# Patient Record
Sex: Male | Born: 1955 | Race: White | Hispanic: No | Smoking: Current every day smoker
Health system: Southern US, Community
[De-identification: ages and names within clinical notes are randomized; demographics above are authoritative.]

## PROBLEM LIST (undated history)

## (undated) DIAGNOSIS — I2510 Atherosclerotic heart disease of native coronary artery without angina pectoris: Secondary | ICD-10-CM

## (undated) DIAGNOSIS — I499 Cardiac arrhythmia, unspecified: Secondary | ICD-10-CM

## (undated) DIAGNOSIS — J449 Chronic obstructive pulmonary disease, unspecified: Secondary | ICD-10-CM

## (undated) DIAGNOSIS — R51 Headache: Secondary | ICD-10-CM

## (undated) DIAGNOSIS — F329 Major depressive disorder, single episode, unspecified: Secondary | ICD-10-CM

## (undated) DIAGNOSIS — D649 Anemia, unspecified: Secondary | ICD-10-CM

## (undated) DIAGNOSIS — F32A Depression, unspecified: Secondary | ICD-10-CM

## (undated) DIAGNOSIS — R519 Headache, unspecified: Secondary | ICD-10-CM

## (undated) DIAGNOSIS — G2581 Restless legs syndrome: Secondary | ICD-10-CM

## (undated) DIAGNOSIS — F419 Anxiety disorder, unspecified: Secondary | ICD-10-CM

## (undated) DIAGNOSIS — I10 Essential (primary) hypertension: Secondary | ICD-10-CM

## (undated) DIAGNOSIS — K219 Gastro-esophageal reflux disease without esophagitis: Secondary | ICD-10-CM

## (undated) DIAGNOSIS — R079 Chest pain, unspecified: Secondary | ICD-10-CM

## (undated) DIAGNOSIS — M1990 Unspecified osteoarthritis, unspecified site: Secondary | ICD-10-CM

## (undated) HISTORY — PX: TONSILLECTOMY: SUR1361

## (undated) HISTORY — PX: CARDIAC CATHETERIZATION: SHX172

## (undated) HISTORY — PX: COLONOSCOPY: SHX174

---

## 2016-07-14 ENCOUNTER — Other Ambulatory Visit: Payer: Self-pay | Admitting: Neurological Surgery

## 2016-07-19 ENCOUNTER — Encounter (HOSPITAL_COMMUNITY): Payer: Self-pay

## 2016-07-19 ENCOUNTER — Encounter (HOSPITAL_COMMUNITY)
Admission: RE | Admit: 2016-07-19 | Discharge: 2016-07-19 | Disposition: A | Discharge status: Home / Self Care | Payer: Medicare HMO | Source: Ambulatory Visit | Attending: Neurological Surgery | Admitting: Neurological Surgery

## 2016-07-19 ENCOUNTER — Encounter (HOSPITAL_COMMUNITY): Payer: Self-pay | Admitting: Vascular Surgery

## 2016-07-19 ENCOUNTER — Other Ambulatory Visit: Payer: Self-pay

## 2016-07-19 DIAGNOSIS — M4722 Other spondylosis with radiculopathy, cervical region: Secondary | ICD-10-CM | POA: Insufficient documentation

## 2016-07-19 DIAGNOSIS — Z01812 Encounter for preprocedural laboratory examination: Secondary | ICD-10-CM | POA: Diagnosis not present

## 2016-07-19 DIAGNOSIS — Z01818 Encounter for other preprocedural examination: Secondary | ICD-10-CM | POA: Diagnosis present

## 2016-07-19 HISTORY — DX: Chronic obstructive pulmonary disease, unspecified: J44.9

## 2016-07-19 HISTORY — DX: Headache, unspecified: R51.9

## 2016-07-19 HISTORY — DX: Unspecified osteoarthritis, unspecified site: M19.90

## 2016-07-19 HISTORY — DX: Headache: R51

## 2016-07-19 HISTORY — DX: Atherosclerotic heart disease of native coronary artery without angina pectoris: I25.10

## 2016-07-19 HISTORY — DX: Chest pain, unspecified: R07.9

## 2016-07-19 HISTORY — DX: Anemia, unspecified: D64.9

## 2016-07-19 HISTORY — DX: Gastro-esophageal reflux disease without esophagitis: K21.9

## 2016-07-19 HISTORY — DX: Depression, unspecified: F32.A

## 2016-07-19 HISTORY — DX: Major depressive disorder, single episode, unspecified: F32.9

## 2016-07-19 HISTORY — DX: Anxiety disorder, unspecified: F41.9

## 2016-07-19 LAB — CBC
HEMATOCRIT: 39.7 % (ref 39.0–52.0)
HEMOGLOBIN: 13.5 g/dL (ref 13.0–17.0)
MCH: 31.8 pg (ref 26.0–34.0)
MCHC: 34 g/dL (ref 30.0–36.0)
MCV: 93.6 fL (ref 78.0–100.0)
Platelets: 255 10*3/uL (ref 150–400)
RBC: 4.24 MIL/uL (ref 4.22–5.81)
RDW: 13.7 % (ref 11.5–15.5)
WBC: 11.4 10*3/uL — ABNORMAL HIGH (ref 4.0–10.5)

## 2016-07-19 LAB — TYPE AND SCREEN
ABO/RH(D): A POS
Antibody Screen: NEGATIVE

## 2016-07-19 LAB — BASIC METABOLIC PANEL
Anion gap: 10 (ref 5–15)
BUN: 6 mg/dL (ref 6–20)
CHLORIDE: 100 mmol/L — AB (ref 101–111)
CO2: 26 mmol/L (ref 22–32)
CREATININE: 0.85 mg/dL (ref 0.61–1.24)
Calcium: 9.2 mg/dL (ref 8.9–10.3)
GFR calc Af Amer: >60 mL/min (ref >60)
GFR calc non Af Amer: >60 mL/min (ref >60)
Glucose, Bld: 106 mg/dL — ABNORMAL HIGH (ref 65–99)
Potassium: 3.6 mmol/L (ref 3.5–5.1)
Sodium: 136 mmol/L (ref 135–145)

## 2016-07-19 LAB — SURGICAL PCR SCREEN
MRSA, PCR: NEGATIVE
Staphylococcus aureus: NEGATIVE

## 2016-07-19 LAB — ABO/RH: ABO/RH(D): A POS

## 2016-07-19 MED ORDER — CHLORHEXIDINE GLUCONATE CLOTH 2 % EX PADS: 6.0000 | MEDICATED_PAD | Freq: Once | CUTANEOUS | Status: DC

## 2016-07-19 NOTE — Pre-Procedure Instructions (Signed)
Todd Bowman  07/19/2016      [id number] - [place], [place] - [address] [place] [address] Phone: [phone] Fax: [phone]    Your procedure is scheduled on Thursday April 12.  Report to [facility] Admitting at 10:00 A.M.  Call this number if you have problems the morning of surgery:  [phone]   Remember:  Do not eat food or drink liquids after midnight.  Take these medicines the morning of surgery with A SIP OF WATER: pantoprazole (protonix), duloxetine (cymbalta), hydrocodone-acetaminophen (Norco) if needed, Symbicort, albuterol inhaler (please bring inhalers to hospital with you)  Take all other medications as prescribed except 7 days prior to surgery STOP taking any diclofenac (voltaren), Aspirin, Aleve, Naproxen, Ibuprofen, Motrin, Advil, Goody's, BC's, all herbal medications, fish oil, and all vitamins    Do not wear jewelry, make-up or nail polish.  Do not wear lotions, powders, or perfumes, or deoderant.  Do not shave 48 hours prior to surgery.  Men may shave face and neck.  Do not bring valuables to the hospital.  [facility] is not responsible for any belongings or valuables.  Contacts, dentures or bridgework may not be worn into surgery.  Leave your suitcase in the car.  After surgery it may be brought to your room.  For patients admitted to the hospital, discharge time will be determined by your treatment team.  Patients discharged the day of surgery will not be allowed to drive home.   Special instructions:    - Preparing For Surgery  Before surgery, you can play an important role. Because skin is not sterile, your skin needs to be as free of germs as possible. You can reduce the number of germs on your skin by washing with CHG (chlorahexidine gluconate) Soap before surgery.  CHG is an antiseptic cleaner which kills germs and bonds with the skin to continue killing germs even after  washing.  Please do not use if you have an allergy to CHG or antibacterial soaps. If your skin becomes reddened/irritated stop using the CHG.  Do not shave (including legs and underarms) for at least 48 hours prior to first CHG shower. It is OK to shave your face.  Please follow these instructions carefully.   1. Shower the NIGHT BEFORE SURGERY and the MORNING OF SURGERY with CHG.   2. If you chose to wash your hair, wash your hair first as usual with your normal shampoo.  3. After you shampoo, rinse your hair and body thoroughly to remove the shampoo.  4. Use CHG as you would any other liquid soap. You can apply CHG directly to the skin and wash gently with a scrungie or a clean washcloth.   5. Apply the CHG Soap to your body ONLY FROM THE NECK DOWN.  Do not use on open wounds or open sores. Avoid contact with your eyes, ears, mouth and genitals (private parts). Wash genitals (private parts) with your normal soap.  6. Wash thoroughly, paying special attention to the area where your surgery will be performed.  7. Thoroughly rinse your body with warm water from the neck down.  8. DO NOT shower/wash with your normal soap after using and rinsing off the CHG Soap.  9. Pat yourself dry with a CLEAN TOWEL.   10. Wear CLEAN PAJAMAS   11. Place CLEAN SHEETS on your bed the night of your first shower and DO NOT SLEEP WITH PETS.  Day of Surgery: Do not apply any deodorants/lotions. Please wear clean clothes to the hospital/surgery center.      Please read over the following fact sheets that you were given. Coughing and Deep Breathing and MRSA Information

## 2016-07-19 NOTE — Progress Notes (Signed)
PCP - Angela Cox Cardiologist - Pt saw Dr. Catha Gosselin in the past for chest pain in 2015, pt brought paper copy of EKG, echo, stress test, and cath from 2015. Reports placed in chart.   EKG - 07/19/16  Stress Test - 2015 ECHO - 2015 Cardiac Cath - 2015  Pt states he had chest tightness about a week ago which went away with Aspirin. Pt states he stopped taking aspirin already prior to surgery. Pt states he has not had any chest pain prior to that in a "long time". Revonda Standard to come speak with pt at PAT appointment. EKG performed.   Patient denies shortness of breath, fever, cough and chest pain at PAT appointment   Patient verbalized understanding of instructions that was given to them at the PAT appointment. Patient expressed that there were no further questions.  Patient was also instructed that they will need to review over the PAT instructions again at home before the surgery.

## 2016-07-19 NOTE — Progress Notes (Addendum)
Anesthesia PAT Evaluation: Patient is a 61 year old male scheduled for C5-6, C6-7 ACDF on 07/21/16 by Dr. Bevely Palmer.   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   Patient lives in [place], [place]. His cell number is [phone]. He also gave permission to call his long time friend Todd Bowman [phone]) if we needed to get a message to him to call us back.  PCP is Dr. Angela Cox [phone]) at [facility] [place] [facility] (previously of [facility] where he first started seeing her).   Cardiologist is Dr. Darlyn Chamber with [facility] [phone]), last visit 12/03/13. His friend goes to Dr. Danella Maiers, so if he needs cardiology re-evaluation he prefers to try him first. He is also willing to see a [facility] cardiologist in the [place] or [place] offices if needed.     Meds include albuterol, ASA 81 mg (on hold), baclofen, Symbicort, Cymbalta, Neurontin, Singulair, Protonix, prednisone X 5 days (last dose 06/18/16), Zantac, Zocor.   BP (!) 153/72   Pulse 62   Temp 36.8 C   Resp 20   Ht  (1.702 m)   Wt 172 lb 12.8 oz (78.4 kg)   SpO2 97%   BMI 27.06 kg/m  Patient is a pleasant Caucasian male in NAD. Heart regular rhythm, rate @ 52 bpm. No murmur noted. Lungs clear without wheezing. No carotid bruits noted. He denied BLE edema. Edentulous. Reports N/T in both hands for "a while" now. Denied grip weakness. He is on disability, but tries to stay active with yard work. Was able to pick up sticks in yard and cut a tree about a week ago, but had to rest multiple times due to SOB but no CP. However, he did report an episode of dull chest pain 2 months ago and again last week just before leaving his house to see Dr. Bevely Palmer. He took a baby ASA and said pain went away after ~ 5 minutes. He denied associated  N/V, diaphoresis, jaw or arm pain. He denied palpitations or syncope. He has not noticed any chest pain associated with activity. He did not tell Dr. Bevely Palmer about his heart history or chest pain symptoms.  He is a [name], but denied home O2 or hospitalization for COPD exacerbations. He has never seen a pulmonologist.   EKG 07/19/16: SB at 52 bpm.   Echo 10/30/13 [facility]): Summary: 1. Normal LV size with borderline normal function. Ejection fraction is 50-55%. Normal LV filling. There is moderate concentric hypertrophy. 2. Normal right ventricular size with normal function. 3. Mild tricuspid regurgitation with mildly redundant tricuspid valve chordae.  Carotid U/S 10/30/13 [facility]): Summary: 1. Right carotid: Normal right ICA. 2. Left carotid: Left ICA less than 50% stenosis. 3. Vertebrals: There is antegrade flow in both the right and left vertebral arteries.  Nuclear stress test 10/30/13 [facility]): Summary: 1. Low normal left ventricular systolic function with an ejection fraction 52%. 2. Moderate sized area of moderate intensity of reversible defect in the inferior wall suggestive of ischemia in the right coronary artery distribution. Cardiac cath recommended (see below).  Cardiac cath 11/06/13 [facility]): Findings: - Left main is a large-caliber vessel. It is moderate in length. It is bifurcated. It is free of disease.  - Left anterior descending is a moderate to large caliber, tortuous vessel which is long and wraps around the apex.  In its midportion of the second diagonal, it has an area of tubular 40-50% moderate stenosis with mid systolic compression. This area was evaluated in multiple views and was not angiographically significant. The LAD gives rise to 2 diagonals. The first is small to moderate and long and has a area of focal 50% late proximal early mid stenosis. The second diagonal is small and has a tubular 40-50% proximal  stenosis. - Left circumflex is a moderate-caliber, nondominant vessel. It gives rise to 2 obtuse marginals. The first is small to moderate and long and has a tubular proximal 50% stenosis. The mid circumflex is mildly irregular and terminates into very small OM 2. -Right coronary artery is a large-caliber, dominant vessel. It has mild 20-30% proximal and mid irregularities and focal 30% distal stenosis. It gives rise to a small posterior descending artery and then a large branching posterolateral artery. Both the branches of the posterolateral artery are moderate to large and unremarkable. - Left ventriculography: The left ventricle is small in size. Estimated ejection fraction 60%. There are no segmental wall motion abnormalities. Summary:  1. Mild to moderate nonobstructive coronary artery disease involving all vessels. 2. Normal left ventricular size and systolic function. Recommendations: The patient's symptoms are not due to unstable angina, and the stress test likely is an artifact. The patient may have vasospasm giving the moderate coronary artery disease and smoking and response to Imdur. The patient requested Imdur be discontinued as it interferes with using medication for erectile dysfunction. The patient's Imdur will be replaced with Norvasc 2.5 mg daily. He will be started on nicotine patch as well as Pravachol 20 mg at night. Smoking cessation was strongly emphasized.  Preoperative labs noted.   Patient with two episodes of non-exertional chest pain over the past two months. There were no associated symptoms, but with known mild-moderate CAD and on-going smoking without recent cardiology follow-up then he will need to see cardiology prior to surgery. I will notify staff at Dr. Mervyn Gay office. In the interim, I reviewed CV symptoms that should prompt EMS evaluation.    Velna Ochs [facility] [place] [facility] Stay [facility]/Anesthesiology Phone [phone] 07/19/2016 5:36 PM

## 2016-08-02 ENCOUNTER — Ambulatory Visit: Payer: Medicare HMO | Admitting: Cardiovascular Disease

## 2016-08-17 ENCOUNTER — Other Ambulatory Visit: Payer: Self-pay | Admitting: Neurological Surgery

## 2016-08-19 ENCOUNTER — Ambulatory Visit (HOSPITAL_COMMUNITY): Admission: RE | Admit: 2016-08-19 | Payer: Medicare HMO | Source: Ambulatory Visit | Admitting: Neurological Surgery

## 2016-08-19 SURGERY — ANTERIOR CERVICAL DECOMPRESSION/DISCECTOMY FUSION 2 LEVELS
Anesthesia: General

## 2016-09-06 ENCOUNTER — Other Ambulatory Visit: Payer: Self-pay | Admitting: Neurological Surgery

## 2016-09-21 ENCOUNTER — Encounter (HOSPITAL_COMMUNITY)
Admission: RE | Admit: 2016-09-21 | Discharge: 2016-09-21 | Disposition: A | Discharge status: Home / Self Care | Payer: Medicare HMO | Source: Ambulatory Visit | Attending: Neurological Surgery | Admitting: Neurological Surgery

## 2016-09-21 ENCOUNTER — Encounter (HOSPITAL_COMMUNITY): Payer: Self-pay

## 2016-09-21 DIAGNOSIS — M4722 Other spondylosis with radiculopathy, cervical region: Secondary | ICD-10-CM | POA: Insufficient documentation

## 2016-09-21 DIAGNOSIS — Z01818 Encounter for other preprocedural examination: Secondary | ICD-10-CM | POA: Insufficient documentation

## 2016-09-21 HISTORY — DX: Cardiac arrhythmia, unspecified: I49.9

## 2016-09-21 LAB — BASIC METABOLIC PANEL
Anion gap: 7 (ref 5–15)
BUN: 12 mg/dL (ref 6–20)
CALCIUM: 9.1 mg/dL (ref 8.9–10.3)
CO2: 24 mmol/L (ref 22–32)
CREATININE: 0.96 mg/dL (ref 0.61–1.24)
Chloride: 106 mmol/L (ref 101–111)
Glucose, Bld: 92 mg/dL (ref 65–99)
Potassium: 3.4 mmol/L — ABNORMAL LOW (ref 3.5–5.1)
SODIUM: 137 mmol/L (ref 135–145)

## 2016-09-21 LAB — CBC
HEMATOCRIT: 40.4 % (ref 39.0–52.0)
HEMOGLOBIN: 13.6 g/dL (ref 13.0–17.0)
MCH: 32.2 pg (ref 26.0–34.0)
MCHC: 33.7 g/dL (ref 30.0–36.0)
MCV: 95.5 fL (ref 78.0–100.0)
Platelets: 263 10*3/uL (ref 150–400)
RBC: 4.23 MIL/uL (ref 4.22–5.81)
RDW: 14.1 % (ref 11.5–15.5)
WBC: 9.6 10*3/uL (ref 4.0–10.5)

## 2016-09-21 LAB — TYPE AND SCREEN
ABO/RH(D): A POS
Antibody Screen: NEGATIVE

## 2016-09-21 LAB — SURGICAL PCR SCREEN
MRSA, PCR: NEGATIVE
STAPHYLOCOCCUS AUREUS: NEGATIVE

## 2016-09-21 NOTE — Progress Notes (Signed)
   09/21/16 1429  OBSTRUCTIVE SLEEP APNEA  Have you ever been diagnosed with sleep apnea through a sleep study? No  Do you snore loudly (loud enough to be heard through closed doors)?  1  Do you often feel tired, fatigued, or sleepy during the daytime (such as falling asleep during driving or talking to someone)? 0  Has anyone observed you stop breathing during your sleep? 1  Do you have, or are you being treated for high blood pressure? 1  BMI more than 35 kg/m2? 0  Age > 50 (1-yes) 1  Neck circumference greater than:Male 16 inches or larger, Male 17inches or larger? 0  Male Gender (Yes=1) 1  Obstructive Sleep Apnea Score 5  Score 5 or greater  Results sent to PCP

## 2016-09-21 NOTE — Pre-Procedure Instructions (Signed)
Todd ConteLarry R Bowman  09/21/2016      [id number] [place], [place] - [address] [place] [id number] Phone: [phone] Fax: [phone]  [facility] - [place], [place] - [address] [place] [id number] Phone: [phone] Fax: [phone]    Your procedure is scheduled on Monday June 18.  [facility] Admitting at 11:15 A.M.  Call this number if you have problems the morning of surgery:  [phone]   Remember:  Do not eat food or drink liquids after midnight.  Take these medicines the morning of surgery with A SIP OF WATER: amlodipine (norvasc), bupropion (wellbutrin), pantoprazole (protonix), hydrocodone (norco) if needed, symbicort, albuterol if needed (please bring inhaler to hospital with you)  7 days prior to surgery STOP taking any diclofenac (voltaren), Aspirin, Aleve, Naproxen, Ibuprofen, Motrin, Advil, Goody's, BC's, all herbal medications, fish oil, and all vitamins    Do not wear jewelry, make-up or nail polish.  Do not wear lotions, powders, or perfumes, or deoderant.  Do not shave 48 hours prior to surgery.  Men may shave face and neck.  Do not bring valuables to the hospital.  [facility] is not responsible for any belongings or valuables.  Contacts, dentures or bridgework may not be worn into surgery.  Leave your suitcase in the car.  After surgery it may be brought to your room.  For patients admitted to the hospital, discharge time will be determined by your treatment team.  Patients discharged the day of surgery will not be allowed to drive home.    Special instructions:    Todd Bowman- Preparing For Surgery  Before surgery, you can play an important role. Because skin is not sterile, your skin needs to be as free of germs as possible. You can reduce the number of germs on your skin by washing with CHG (chlorahexidine gluconate) Soap before surgery.  CHG  is an antiseptic cleaner which kills germs and bonds with the skin to continue killing germs even after washing.  Please do not use if you have an allergy to CHG or antibacterial soaps. If your skin becomes reddened/irritated stop using the CHG.  Do not shave (including legs and underarms) for at least 48 hours prior to first CHG shower. It is OK to shave your face.  Please follow these instructions carefully.   1. Shower the NIGHT BEFORE SURGERY and the MORNING OF SURGERY with CHG.   2. If you chose to wash your hair, wash your hair first as usual with your normal shampoo.  3. After you shampoo, rinse your hair and body thoroughly to remove the shampoo.  4. Use CHG as you would any other liquid soap. You can apply CHG directly to the skin and wash gently with a scrungie or a clean washcloth.   5. Apply the CHG Soap to your body ONLY FROM THE NECK DOWN.  Do not use on open wounds or open sores. Avoid contact with your eyes, ears, mouth and genitals (private parts). Wash genitals (private parts) with your normal soap.  6. Wash thoroughly, paying special attention to the area where your surgery will be performed.  7. Thoroughly rinse your body with warm water from the neck down.  8. DO NOT shower/wash with your normal soap after using and rinsing off the CHG Soap.  9. Pat yourself dry with a CLEAN TOWEL.   10. Wear CLEAN PAJAMAS   11. Place CLEAN SHEETS  on your bed the night of your first shower and DO NOT SLEEP WITH PETS.    Day of Surgery: Do not apply any deodorants/lotions. Please wear clean clothes to the hospital/surgery center.      Please read over the following fact sheets that you were given. MRSA Information

## 2016-09-21 NOTE — Progress Notes (Signed)
PCP - Dr. Carmon SailsBorino Cardiologist - Dr. Catha GosselinBanerjee   EKG - 07/19/16 Stress Test - pt had recent stress test- requested from Dr. Catha GosselinBanerjee ECHO - 2015 Cardiac Cath - 09/01/16, paper copy in chart  Pt previously cancelled d/t chest pain and need for cardiac evaluation. Pt states he had heart cath and has not had any more issues with chest pain. Chart forwarded to anesthesia for review Patient denies shortness of breath, fever, cough and chest pain at PAT appointment   Patient verbalized understanding of instructions that were given to them at the PAT appointment. Patient was also instructed that they will need to review over the PAT instructions again at home before surgery.

## 2016-09-22 ENCOUNTER — Encounter (HOSPITAL_COMMUNITY): Payer: Self-pay

## 2016-09-22 NOTE — Progress Notes (Signed)
Anesthesia Chart Review: Patient is a 61 year old male scheduled for C5-6, C6-7 ACDF on 09/26/16 by Dr. Bevely Palmeritty. Case was initially scheduled for 07/21/16, but was postponed to allow for cardiology evaluation due to episodes of non-exertional chest pain with known mild-moderate CAD from 2015 cath. Since then, he had an abnormal stress test followed by cardiac cath showing non-obstructive CAD. Cardiologist Dr. Darlyn Chambereepak Banerjee subsequently cleared patient at "low cardiovascular risk for cervical spine surgery under general anesthesia." .   History includes COPD, smoking (1 1/2 PPD X 50 years), anxiety, depression, GERD, headaches, arthritis, CAD (mild to moderate non-obstructive CAD 10/2013, 08/2016), anemia, tonsillectomy.   [place] in [place], [place]. His cell number is [phone]. In April 2018, he also gave permission to call his long time friend Blanchie DessertLinda Blankenship [phone]) if we needed to get a message to him to call us back.  PCP is Dr. Angela CoxAlethea Barrino [phone]) at [facility] ([facility] where he [facility] started seeing her).   Cardiologist is Dr. Darlyn Chambereepak Banerjee at [facility] [phone]).  Meds include albuterol, amlodipine, ASA 81 mg (on hold), baclofen, Symbicort, Wellbutrin SR, Neurontin, Norco, Singulair, Protonix, Zantac, Zocor.   BP 135/69   Pulse 65   Temp 36.7 C   Resp 20   Ht 5\' 7"  (1.702 m)   Wt 177 lb (80.3 kg)   SpO2 99%   BMI 27.72 kg/m   EKG 07/19/16: SB at 52 bpm.   LHC 09/01/16 ([place]; Dr. Alvino Bloodeepak Benerjee; done due to abnormal stress test showing "inferior ischemia"): Conclusion: 1. No significant obstructive coronary artery disease, mild coronary artery disease of the vessels as noted (40% mid LAD, 40% proximal D1 [small vessel], 20% ostial CX [small vessel], 20-30% proximal and mid RCA, 20% distal RCA). 2. Normal left ventricular size, systolic function. EF greater than  60%. Recommendations: 1. Continue aggressive risk factor modification including smoking cessation. 2. The patient has low cardiovascular risk for cervical spine surgery under general anesthesia.  Echo 10/30/13 [facility] Summary: 1. Normal LV size with borderline normal function. Ejection fraction is 50-55%. Normal LV filling. There is moderate concentric hypertrophy. 2. Normal right ventricular size with normal function. 3. Mild tricuspid regurgitation with mildly redundant tricuspid valve chordae.  Carotid U/S 10/30/13 [facility]): Summary: 1. Right carotid: Normal right ICA. 2. Left carotid: Left ICA less than 50% stenosis. 3. Vertebrals: There is antegrade flow in both the right and left vertebral arteries.  Preoperative labs noted.   He had a cardiac work-up for his chest pain that occurred several months ago. He has been cleared by cardiology. If no acute changes then I anticipate that he can proceed as planned.  Velna Ochsllison Hillari Zumwalt, PA-C [facility] Short Stay Center/Anesthesiology Phone [phone] 09/22/2016 11:47 AM

## 2016-09-25 MED ORDER — CEFAZOLIN SODIUM-DEXTROSE 2-4 GM/100ML-% IV SOLN
2.0000 g | INTRAVENOUS | Status: AC
  Administered 2016-09-26: 2 g via INTRAVENOUS
  Filled 2016-09-25: qty 100

## 2016-09-26 ENCOUNTER — Encounter (HOSPITAL_COMMUNITY)
Admission: RE | Disposition: A | Discharge status: Home / Self Care | Payer: Self-pay | Source: Ambulatory Visit | Attending: Neurological Surgery

## 2016-09-26 ENCOUNTER — Encounter (HOSPITAL_COMMUNITY): Payer: Self-pay | Admitting: General Practice

## 2016-09-26 ENCOUNTER — Ambulatory Visit (HOSPITAL_COMMUNITY): Payer: Medicare HMO

## 2016-09-26 ENCOUNTER — Ambulatory Visit (HOSPITAL_COMMUNITY)
Admission: RE | Admit: 2016-09-26 | Discharge: 2016-09-27 | Disposition: A | Discharge status: Home / Self Care | Payer: Medicare HMO | Source: Ambulatory Visit | Attending: Neurological Surgery | Admitting: Neurological Surgery

## 2016-09-26 ENCOUNTER — Ambulatory Visit (HOSPITAL_COMMUNITY): Payer: Medicare HMO | Admitting: Certified Registered Nurse Anesthetist

## 2016-09-26 ENCOUNTER — Ambulatory Visit (HOSPITAL_COMMUNITY): Payer: Medicare HMO | Admitting: Emergency Medicine

## 2016-09-26 DIAGNOSIS — F17200 Nicotine dependence, unspecified, uncomplicated: Secondary | ICD-10-CM | POA: Diagnosis not present

## 2016-09-26 DIAGNOSIS — Z79899 Other long term (current) drug therapy: Secondary | ICD-10-CM | POA: Diagnosis not present

## 2016-09-26 DIAGNOSIS — M4722 Other spondylosis with radiculopathy, cervical region: Secondary | ICD-10-CM | POA: Diagnosis not present

## 2016-09-26 DIAGNOSIS — I2510 Atherosclerotic heart disease of native coronary artery without angina pectoris: Secondary | ICD-10-CM | POA: Diagnosis not present

## 2016-09-26 DIAGNOSIS — Z7982 Long term (current) use of aspirin: Secondary | ICD-10-CM | POA: Insufficient documentation

## 2016-09-26 DIAGNOSIS — J449 Chronic obstructive pulmonary disease, unspecified: Secondary | ICD-10-CM | POA: Insufficient documentation

## 2016-09-26 DIAGNOSIS — K219 Gastro-esophageal reflux disease without esophagitis: Secondary | ICD-10-CM | POA: Diagnosis not present

## 2016-09-26 DIAGNOSIS — F17210 Nicotine dependence, cigarettes, uncomplicated: Secondary | ICD-10-CM | POA: Insufficient documentation

## 2016-09-26 DIAGNOSIS — Z419 Encounter for procedure for purposes other than remedying health state, unspecified: Secondary | ICD-10-CM

## 2016-09-26 DIAGNOSIS — F329 Major depressive disorder, single episode, unspecified: Secondary | ICD-10-CM | POA: Insufficient documentation

## 2016-09-26 HISTORY — PX: ANTERIOR CERVICAL DECOMP/DISCECTOMY FUSION: SHX1161

## 2016-09-26 SURGERY — ANTERIOR CERVICAL DECOMPRESSION/DISCECTOMY FUSION 2 LEVELS
Anesthesia: General | Site: Neck

## 2016-09-26 MED ORDER — ONDANSETRON HCL 4 MG PO TABS: 4.0000 mg | ORAL_TABLET | Freq: Four times a day (QID) | ORAL | Status: DC | PRN

## 2016-09-26 MED ORDER — FENTANYL CITRATE (PF) 100 MCG/2ML IJ SOLN
25.0000 ug | INTRAMUSCULAR | Status: DC | PRN
  Administered 2016-09-26 (×2): 50 ug via INTRAVENOUS

## 2016-09-26 MED ORDER — GABAPENTIN 300 MG PO CAPS: 300.0000 mg | ORAL_CAPSULE | Freq: Three times a day (TID) | ORAL | Status: DC

## 2016-09-26 MED ORDER — FENTANYL CITRATE (PF) 100 MCG/2ML IJ SOLN
INTRAMUSCULAR | Status: AC
  Administered 2016-09-26: 50 ug via INTRAVENOUS
  Filled 2016-09-26: qty 2

## 2016-09-26 MED ORDER — CELECOXIB 200 MG PO CAPS
200.0000 mg | ORAL_CAPSULE | Freq: Two times a day (BID) | ORAL | Status: DC
  Administered 2016-09-26 – 2016-09-27 (×2): 200 mg via ORAL
  Filled 2016-09-26 (×2): qty 1

## 2016-09-26 MED ORDER — ROCURONIUM BROMIDE 10 MG/ML (PF) SYRINGE
PREFILLED_SYRINGE | INTRAVENOUS | Status: AC
  Filled 2016-09-26: qty 5

## 2016-09-26 MED ORDER — SODIUM CHLORIDE 0.9 % IR SOLN
Status: DC | PRN
  Administered 2016-09-26: 500 mL

## 2016-09-26 MED ORDER — ZOLPIDEM TARTRATE 5 MG PO TABS: 5.0000 mg | ORAL_TABLET | Freq: Every evening | ORAL | Status: DC | PRN

## 2016-09-26 MED ORDER — FENTANYL CITRATE (PF) 250 MCG/5ML IJ SOLN
INTRAMUSCULAR | Status: AC
  Filled 2016-09-26: qty 5

## 2016-09-26 MED ORDER — BISACODYL 10 MG RE SUPP: 10.0000 mg | Freq: Every day | RECTAL | Status: DC | PRN

## 2016-09-26 MED ORDER — LACTATED RINGERS IV SOLN
INTRAVENOUS | Status: DC
  Administered 2016-09-26 (×2): via INTRAVENOUS

## 2016-09-26 MED ORDER — MENTHOL 3 MG MT LOZG: 1.0000 | LOZENGE | OROMUCOSAL | Status: DC | PRN

## 2016-09-26 MED ORDER — PANTOPRAZOLE SODIUM 40 MG PO TBEC
40.0000 mg | DELAYED_RELEASE_TABLET | Freq: Every day | ORAL | Status: DC
  Administered 2016-09-27: 40 mg via ORAL
  Filled 2016-09-26: qty 1

## 2016-09-26 MED ORDER — SODIUM CHLORIDE 0.9% FLUSH: 3.0000 mL | INTRAVENOUS | Status: DC | PRN

## 2016-09-26 MED ORDER — PANTOPRAZOLE SODIUM 40 MG IV SOLR: 40.0000 mg | Freq: Every day | INTRAVENOUS | Status: DC

## 2016-09-26 MED ORDER — BUPIVACAINE-EPINEPHRINE (PF) 0.5% -1:200000 IJ SOLN
INTRAMUSCULAR | Status: AC
  Filled 2016-09-26: qty 30

## 2016-09-26 MED ORDER — PHENOL 1.4 % MT LIQD: 1.0000 | OROMUCOSAL | Status: DC | PRN

## 2016-09-26 MED ORDER — EPHEDRINE 5 MG/ML INJ
INTRAVENOUS | Status: AC
  Filled 2016-09-26: qty 20

## 2016-09-26 MED ORDER — THROMBIN 5000 UNITS EX SOLR
CUTANEOUS | Status: AC
  Filled 2016-09-26: qty 15000

## 2016-09-26 MED ORDER — MOMETASONE FURO-FORMOTEROL FUM 200-5 MCG/ACT IN AERO
2.0000 | INHALATION_SPRAY | Freq: Two times a day (BID) | RESPIRATORY_TRACT | Status: DC
  Filled 2016-09-26: qty 8.8

## 2016-09-26 MED ORDER — LIDOCAINE HCL (CARDIAC) 20 MG/ML IV SOLN
INTRAVENOUS | Status: DC | PRN
  Administered 2016-09-26: 100 mg via INTRAVENOUS

## 2016-09-26 MED ORDER — BUPROPION HCL ER (SR) 100 MG PO TB12
100.0000 mg | ORAL_TABLET | Freq: Two times a day (BID) | ORAL | Status: DC
  Administered 2016-09-26 – 2016-09-27 (×2): 100 mg via ORAL
  Filled 2016-09-26 (×2): qty 1

## 2016-09-26 MED ORDER — THROMBIN 5000 UNITS EX SOLR
CUTANEOUS | Status: DC | PRN
  Administered 2016-09-26: 10000 [IU] via TOPICAL

## 2016-09-26 MED ORDER — DICLOFENAC SODIUM 75 MG PO TBEC
75.0000 mg | DELAYED_RELEASE_TABLET | Freq: Every day | ORAL | Status: DC
  Administered 2016-09-27: 75 mg via ORAL
  Filled 2016-09-26: qty 1

## 2016-09-26 MED ORDER — SUGAMMADEX SODIUM 200 MG/2ML IV SOLN
INTRAVENOUS | Status: DC | PRN
Start: 2016-09-26 — End: 2016-09-26
  Administered 2016-09-26: 160 mg via INTRAVENOUS

## 2016-09-26 MED ORDER — SODIUM CHLORIDE 0.9 % IV SOLN
INTRAVENOUS | Status: DC
  Administered 2016-09-26: 19:00:00 via INTRAVENOUS

## 2016-09-26 MED ORDER — MIDAZOLAM HCL 2 MG/2ML IJ SOLN
INTRAMUSCULAR | Status: AC
  Filled 2016-09-26: qty 2

## 2016-09-26 MED ORDER — ONDANSETRON HCL 4 MG/2ML IJ SOLN
INTRAMUSCULAR | Status: AC
  Filled 2016-09-26: qty 2

## 2016-09-26 MED ORDER — PHENYLEPHRINE HCL 10 MG/ML IJ SOLN
INTRAMUSCULAR | Status: DC | PRN
  Administered 2016-09-26 (×5): 80 ug via INTRAVENOUS

## 2016-09-26 MED ORDER — BACLOFEN 20 MG PO TABS
20.0000 mg | ORAL_TABLET | Freq: Three times a day (TID) | ORAL | Status: DC
  Administered 2016-09-26 – 2016-09-27 (×2): 20 mg via ORAL
  Filled 2016-09-26 (×2): qty 1

## 2016-09-26 MED ORDER — AMLODIPINE BESYLATE 5 MG PO TABS
5.0000 mg | ORAL_TABLET | Freq: Every day | ORAL | Status: DC
Start: 2016-09-27 — End: 2016-09-27
  Administered 2016-09-27: 5 mg via ORAL
  Filled 2016-09-26: qty 1

## 2016-09-26 MED ORDER — LIDOCAINE-EPINEPHRINE 2 %-1:100000 IJ SOLN
INTRAMUSCULAR | Status: AC
  Filled 2016-09-26: qty 1

## 2016-09-26 MED ORDER — ALBUTEROL SULFATE HFA 108 (90 BASE) MCG/ACT IN AERS: 1.0000 | INHALATION_SPRAY | Freq: Four times a day (QID) | RESPIRATORY_TRACT | Status: DC

## 2016-09-26 MED ORDER — PROPOFOL 10 MG/ML IV BOLUS
INTRAVENOUS | Status: AC
  Filled 2016-09-26: qty 20

## 2016-09-26 MED ORDER — LIDOCAINE-EPINEPHRINE 2 %-1:100000 IJ SOLN
INTRAMUSCULAR | Status: DC | PRN
  Administered 2016-09-26: 20 mL

## 2016-09-26 MED ORDER — MONTELUKAST SODIUM 10 MG PO TABS
10.0000 mg | ORAL_TABLET | Freq: Every day | ORAL | Status: DC
  Administered 2016-09-26: 10 mg via ORAL
  Filled 2016-09-26: qty 1

## 2016-09-26 MED ORDER — FAMOTIDINE 20 MG PO TABS
20.0000 mg | ORAL_TABLET | Freq: Two times a day (BID) | ORAL | Status: DC
Start: 2016-09-26 — End: 2016-09-27
  Administered 2016-09-26 – 2016-09-27 (×2): 20 mg via ORAL
  Filled 2016-09-26 (×2): qty 1

## 2016-09-26 MED ORDER — BUPIVACAINE-EPINEPHRINE (PF) 0.5% -1:200000 IJ SOLN
INTRAMUSCULAR | Status: DC | PRN
Start: 2016-09-26 — End: 2016-09-26
  Administered 2016-09-26: 20 mL

## 2016-09-26 MED ORDER — DOCUSATE SODIUM 100 MG PO CAPS
100.0000 mg | ORAL_CAPSULE | Freq: Two times a day (BID) | ORAL | Status: DC
  Administered 2016-09-26 – 2016-09-27 (×2): 100 mg via ORAL
  Filled 2016-09-26 (×2): qty 1

## 2016-09-26 MED ORDER — PROPOFOL 10 MG/ML IV BOLUS
INTRAVENOUS | Status: DC | PRN
  Administered 2016-09-26: 200 mg via INTRAVENOUS

## 2016-09-26 MED ORDER — FLEET ENEMA 7-19 GM/118ML RE ENEM: 1.0000 | ENEMA | Freq: Once | RECTAL | Status: DC | PRN

## 2016-09-26 MED ORDER — SUCCINYLCHOLINE CHLORIDE 20 MG/ML IJ SOLN
INTRAMUSCULAR | Status: DC | PRN
  Administered 2016-09-26: 120 mg via INTRAVENOUS

## 2016-09-26 MED ORDER — GABAPENTIN 300 MG PO CAPS
300.0000 mg | ORAL_CAPSULE | Freq: Three times a day (TID) | ORAL | Status: DC
  Administered 2016-09-26 – 2016-09-27 (×2): 300 mg via ORAL
  Filled 2016-09-26 (×2): qty 1

## 2016-09-26 MED ORDER — PHENYLEPHRINE 40 MCG/ML (10ML) SYRINGE FOR IV PUSH (FOR BLOOD PRESSURE SUPPORT)
PREFILLED_SYRINGE | INTRAVENOUS | Status: AC
  Filled 2016-09-26: qty 10

## 2016-09-26 MED ORDER — LIDOCAINE 2% (20 MG/ML) 5 ML SYRINGE
INTRAMUSCULAR | Status: AC
  Filled 2016-09-26: qty 5

## 2016-09-26 MED ORDER — DEXAMETHASONE SODIUM PHOSPHATE 10 MG/ML IJ SOLN
INTRAMUSCULAR | Status: DC | PRN
  Administered 2016-09-26: 10 mg via INTRAVENOUS

## 2016-09-26 MED ORDER — MIDAZOLAM HCL 5 MG/5ML IJ SOLN
INTRAMUSCULAR | Status: DC | PRN
  Administered 2016-09-26: 2 mg via INTRAVENOUS

## 2016-09-26 MED ORDER — ROCURONIUM BROMIDE 100 MG/10ML IV SOLN
INTRAVENOUS | Status: DC | PRN
  Administered 2016-09-26: 50 mg via INTRAVENOUS

## 2016-09-26 MED ORDER — ALBUTEROL SULFATE (2.5 MG/3ML) 0.083% IN NEBU: 2.5000 mg | INHALATION_SOLUTION | Freq: Four times a day (QID) | RESPIRATORY_TRACT | Status: DC

## 2016-09-26 MED ORDER — ONDANSETRON HCL 4 MG/2ML IJ SOLN: 4.0000 mg | Freq: Four times a day (QID) | INTRAMUSCULAR | Status: DC | PRN

## 2016-09-26 MED ORDER — 0.9 % SODIUM CHLORIDE (POUR BTL) OPTIME
TOPICAL | Status: DC | PRN
  Administered 2016-09-26: 1000 mL

## 2016-09-26 MED ORDER — CEFAZOLIN SODIUM-DEXTROSE 1-4 GM/50ML-% IV SOLN
1.0000 g | Freq: Three times a day (TID) | INTRAVENOUS | Status: AC
  Administered 2016-09-26 – 2016-09-27 (×2): 1 g via INTRAVENOUS
  Filled 2016-09-26 (×2): qty 50

## 2016-09-26 MED ORDER — ASPIRIN EC 81 MG PO TBEC
81.0000 mg | DELAYED_RELEASE_TABLET | Freq: Every day | ORAL | Status: DC
  Administered 2016-09-27: 81 mg via ORAL
  Filled 2016-09-26: qty 1

## 2016-09-26 MED ORDER — FENTANYL CITRATE (PF) 100 MCG/2ML IJ SOLN
INTRAMUSCULAR | Status: DC | PRN
  Administered 2016-09-26: 100 ug via INTRAVENOUS
  Administered 2016-09-26 (×4): 50 ug via INTRAVENOUS

## 2016-09-26 MED ORDER — SODIUM CHLORIDE 0.9% FLUSH
3.0000 mL | Freq: Two times a day (BID) | INTRAVENOUS | Status: DC
  Administered 2016-09-26: 3 mL via INTRAVENOUS

## 2016-09-26 MED ORDER — ONDANSETRON HCL 4 MG/2ML IJ SOLN
INTRAMUSCULAR | Status: DC | PRN
  Administered 2016-09-26: 4 mg via INTRAVENOUS

## 2016-09-26 MED ORDER — SUGAMMADEX SODIUM 200 MG/2ML IV SOLN
INTRAVENOUS | Status: AC
  Filled 2016-09-26: qty 2

## 2016-09-26 MED ORDER — ACETAMINOPHEN 500 MG PO TABS
1000.0000 mg | ORAL_TABLET | Freq: Four times a day (QID) | ORAL | Status: DC
  Administered 2016-09-26 – 2016-09-27 (×3): 1000 mg via ORAL
  Filled 2016-09-26 (×3): qty 2

## 2016-09-26 MED ORDER — THROMBIN 5000 UNITS EX SOLR
OROMUCOSAL | Status: DC | PRN
  Administered 2016-09-26: 5 mL via TOPICAL

## 2016-09-26 MED ORDER — SIMVASTATIN 20 MG PO TABS
20.0000 mg | ORAL_TABLET | Freq: Every day | ORAL | Status: DC
  Administered 2016-09-26: 20 mg via ORAL
  Filled 2016-09-26: qty 1

## 2016-09-26 MED ORDER — EPHEDRINE SULFATE 50 MG/ML IJ SOLN
INTRAMUSCULAR | Status: DC | PRN
  Administered 2016-09-26 (×2): 10 mg via INTRAVENOUS

## 2016-09-26 MED ORDER — DIAZEPAM 5 MG PO TABS
5.0000 mg | ORAL_TABLET | Freq: Four times a day (QID) | ORAL | Status: DC | PRN
  Administered 2016-09-27 (×2): 5 mg via ORAL
  Filled 2016-09-26 (×2): qty 1

## 2016-09-26 MED ORDER — SENNA 8.6 MG PO TABS
1.0000 | ORAL_TABLET | Freq: Two times a day (BID) | ORAL | Status: DC
  Administered 2016-09-26 – 2016-09-27 (×2): 8.6 mg via ORAL
  Filled 2016-09-26 (×2): qty 1

## 2016-09-26 MED ORDER — OXYCODONE HCL 5 MG PO TABS
5.0000 mg | ORAL_TABLET | ORAL | Status: DC | PRN
  Administered 2016-09-26 – 2016-09-27 (×3): 10 mg via ORAL
  Filled 2016-09-26 (×3): qty 2

## 2016-09-26 SURGICAL SUPPLY — 70 items
BIT DRILL INVIZIA (BIT) ×1 IMPLANT
BLADE ULTRA TIP 2M (BLADE) IMPLANT
BUR MATCHSTICK NEURO 3.0 LAGG (BURR) ×3 IMPLANT
CANISTER SUCT 3000ML PPV (MISCELLANEOUS) ×3 IMPLANT
CARTRIDGE OIL MAESTRO DRILL (MISCELLANEOUS) ×1 IMPLANT
CHLORAPREP W/TINT 26ML (MISCELLANEOUS) ×3 IMPLANT
DECANTER SPIKE VIAL GLASS SM (MISCELLANEOUS) IMPLANT
DERMABOND ADVANCED (GAUZE/BANDAGES/DRESSINGS) ×2
DERMABOND ADVANCED .7 DNX12 (GAUZE/BANDAGES/DRESSINGS) ×1 IMPLANT
DIFFUSER DRILL AIR PNEUMATIC (MISCELLANEOUS) ×3 IMPLANT
DRAPE C-ARM 42X72 X-RAY (DRAPES) ×6 IMPLANT
DRAPE HALF SHEET 40X57 (DRAPES) IMPLANT
DRAPE LAPAROTOMY 100X72 PEDS (DRAPES) ×3 IMPLANT
DRAPE MICROSCOPE LEICA (MISCELLANEOUS) ×3 IMPLANT
DRAPE POUCH INSTRU U-SHP 10X18 (DRAPES) IMPLANT
DRAPE SHEET LG 3/4 BI-LAMINATE (DRAPES) ×3 IMPLANT
DRILL BIT INVIZIA (BIT) ×3
DRSG OPSITE POSTOP 3X4 (GAUZE/BANDAGES/DRESSINGS) ×3 IMPLANT
ELECT COATED BLADE 2.86 ST (ELECTRODE) ×3 IMPLANT
ELECT REM PT RETURN 9FT ADLT (ELECTROSURGICAL) ×3
ELECTRODE REM PT RTRN 9FT ADLT (ELECTROSURGICAL) ×1 IMPLANT
EVACUATOR 1/8 PVC DRAIN (DRAIN) ×3 IMPLANT
GAUZE SPONGE 4X4 12PLY STRL (GAUZE/BANDAGES/DRESSINGS) IMPLANT
GAUZE SPONGE 4X4 16PLY XRAY LF (GAUZE/BANDAGES/DRESSINGS) IMPLANT
GLOVE BIO SURGEON STRL SZ7 (GLOVE) ×3 IMPLANT
GLOVE BIOGEL PI IND STRL 7.0 (GLOVE) IMPLANT
GLOVE BIOGEL PI IND STRL 7.5 (GLOVE) ×2 IMPLANT
GLOVE BIOGEL PI IND STRL 8 (GLOVE) ×2 IMPLANT
GLOVE BIOGEL PI INDICATOR 7.0 (GLOVE)
GLOVE BIOGEL PI INDICATOR 7.5 (GLOVE) ×4
GLOVE BIOGEL PI INDICATOR 8 (GLOVE) ×4
GLOVE ECLIPSE 7.5 STRL STRAW (GLOVE) ×9 IMPLANT
GLOVE SS BIOGEL STRL SZ 7.5 (GLOVE) ×2 IMPLANT
GLOVE SUPERSENSE BIOGEL SZ 7.5 (GLOVE) ×4
GOWN STRL REUS W/ TWL LRG LVL3 (GOWN DISPOSABLE) ×2 IMPLANT
GOWN STRL REUS W/ TWL XL LVL3 (GOWN DISPOSABLE) ×1 IMPLANT
GOWN STRL REUS W/TWL 2XL LVL3 (GOWN DISPOSABLE) ×6 IMPLANT
GOWN STRL REUS W/TWL LRG LVL3 (GOWN DISPOSABLE) ×4
GOWN STRL REUS W/TWL XL LVL3 (GOWN DISPOSABLE) ×2
HEMOSTAT POWDER KIT SURGIFOAM (HEMOSTASIS) ×3 IMPLANT
INTERBODY TM 14X14X7-7DEG ANG (Metal Cage) ×6 IMPLANT
KIT BASIN OR (CUSTOM PROCEDURE TRAY) ×3 IMPLANT
KIT ROOM TURNOVER OR (KITS) ×3 IMPLANT
NEEDLE HYPO 21X1.5 SAFETY (NEEDLE) ×3 IMPLANT
NEEDLE SPNL 18GX3.5 QUINCKE PK (NEEDLE) ×3 IMPLANT
NS IRRIG 1000ML POUR BTL (IV SOLUTION) ×3 IMPLANT
OIL CARTRIDGE MAESTRO DRILL (MISCELLANEOUS) ×3
PACK LAMINECTOMY NEURO (CUSTOM PROCEDURE TRAY) ×3 IMPLANT
PAD ARMBOARD 7.5X6 YLW CONV (MISCELLANEOUS) ×9 IMPLANT
PATTIES SURGICAL .5X1.5 (GAUZE/BANDAGES/DRESSINGS) IMPLANT
PIN DISTRACTION 14MM (PIN) ×6 IMPLANT
PLATE INVIZIA 2 LEV 40MM (Plate) ×3 IMPLANT
PUTTY BONE GRAFT KIT 2.5ML (Bone Implant) ×3 IMPLANT
RUBBERBAND STERILE (MISCELLANEOUS) ×6 IMPLANT
SCREW 16MM (Screw) ×12 IMPLANT
SCREW SPINAL 42.X16MM TITAN (Screw) ×6 IMPLANT
SPONGE INTESTINAL PEANUT (DISPOSABLE) ×3 IMPLANT
SPONGE SURGIFOAM ABS GEL SZ50 (HEMOSTASIS) ×3 IMPLANT
STAPLER VISISTAT 35W (STAPLE) ×3 IMPLANT
STOCKINETTE 6  STRL (DRAPES) ×2
STOCKINETTE 6 STRL (DRAPES) ×1 IMPLANT
SUT STRATAFIX MNCRL+ 3-0 PS-2 (SUTURE) ×2
SUT STRATAFIX MONOCRYL 3-0 (SUTURE) ×4
SUT VIC AB 3-0 SH 8-18 (SUTURE) ×3 IMPLANT
SUTURE STRATFX MNCRL+ 3-0 PS-2 (SUTURE) ×2 IMPLANT
TOWEL GREEN STERILE (TOWEL DISPOSABLE) ×3 IMPLANT
TOWEL GREEN STERILE FF (TOWEL DISPOSABLE) ×3 IMPLANT
TUBE CONNECTING 12'X1/4 (SUCTIONS)
TUBE CONNECTING 12X1/4 (SUCTIONS) IMPLANT
WATER STERILE IRR 1000ML POUR (IV SOLUTION) ×3 IMPLANT

## 2016-09-26 NOTE — Anesthesia Procedure Notes (Signed)
Procedure Name: Intubation Date/Time: 09/26/2016 2:37 PM Performed by: [name], [name] Pre-anesthesia Checklist: Patient identified, Emergency Drugs available, Suction available and Patient being monitored Patient Re-evaluated:Patient Re-evaluated prior to inductionOxygen Delivery Method: Circle System Utilized Preoxygenation: Pre-oxygenation with 100% oxygen Intubation Type: IV induction, Rapid sequence and Cricoid Pressure applied Laryngoscope Size: Glidescope and 4 (elective glidescope d/t neuropathies) Grade View: Grade I Tube type: Oral Tube size: 7.5 mm Number of attempts: 1 Airway Equipment and Method: Stylet Placement Confirmation: ETT inserted through vocal cords under direct vision,  positive ETCO2 and breath sounds checked- equal and bilateral Secured at: 23 cm Tube secured with: Tape Dental Injury: Teeth and Oropharynx as per pre-operative assessment

## 2016-09-26 NOTE — H&P (Signed)
CC:  No chief complaint on file. Neck, arm, and hand pain  HPI: Todd Bowman is a 61 y.o. 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  He presents for elective C5-7 ACDF.  No changes since I saw him in clinic.  Complains of pain in neck, shoulders, arms, and hands.  Right worse than left.  PMH: Past Medical History:  Diagnosis Date  . Anemia   . Anxiety   . Arthritis   . Chest pain    had work-up including 09/01/16 ([place]) LHC showing non-obstructive CAD.   Marland Kitchen. COPD (chronic obstructive pulmonary disease) (HCC)   . Coronary artery disease    mild to moderate non-obstructive per cath   . Depression   . Dysrhythmia    "irregular heart beat"  . GERD (gastroesophageal reflux disease)   . Headache     PSH: Past Surgical History:  Procedure Laterality Date  . CARDIAC CATHETERIZATION     "mild to moderate nonubstructive coronary artery disease involving all vessels" '15; 09/01/16 LHC ([place]): 40% mLAD, 40% pD1, 20% oLCx, 20-30% prox-mid RCA, 20% dRCA, EF 60%. Medical Rx (Dr. Darlyn Chambereepak Banerjee)  . COLONOSCOPY     polyps removed  . TONSILLECTOMY      SH: Social History  Substance Use Topics  . Smoking status: Current Every Day Smoker    Packs/day: 1.50    Years: 50.00    Types: Cigarettes  . Smokeless tobacco: Never Used  . Alcohol use No    MEDS: Prior to Admission medications   Medication Sig Start Date End Date Taking? Authorizing Provider  albuterol (PROVENTIL HFA;VENTOLIN HFA) 108 (90 Base) MCG/ACT inhaler Inhale 1 puff into the lungs 4 (four) times daily. (scheduled per patient)   Yes [provider]  amLODipine (NORVASC) 5 MG tablet Take 5 mg by mouth daily.   Yes [provider]  aspirin EC 81 MG tablet Take 81 mg by mouth daily.   Yes [provider]  baclofen (LIORESAL) 20 MG tablet Take 20 mg by mouth 3 (three) times daily.   Yes [provider]  budesonide-formoterol  (SYMBICORT) 160-4.5 MCG/ACT inhaler Inhale 2 puffs into the lungs 2 (two) times daily.    Yes [provider]  buPROPion (WELLBUTRIN SR) 100 MG 12 hr tablet Take 100 mg by mouth 2 (two) times daily.   Yes [provider]  diclofenac (VOLTAREN) 75 MG EC tablet Take 75 mg by mouth daily.    Yes [provider]  gabapentin (NEURONTIN) 100 MG capsule Take 300 mg by mouth at bedtime.    Yes [provider]  HYDROcodone-acetaminophen (NORCO) 7.5-325 MG tablet Take 1 tablet by mouth every 8 (eight) hours.    Yes [provider]  montelukast (SINGULAIR) 10 MG tablet Take 10 mg by mouth at bedtime.   Yes [provider]  pantoprazole (PROTONIX) 40 MG tablet Take 40 mg by mouth daily before breakfast.    Yes [provider]  ranitidine (ZANTAC) 300 MG tablet Take 300 mg by mouth at bedtime. 06/09/16  Yes [provider]  simvastatin (ZOCOR) 20 MG tablet Take 20 mg by mouth at bedtime.   Yes [provider]    ALLERGY: Allergies  Allergen Reactions  . Tramadol Itching, Rash and Other (See Comments)    HEADACHE BLURRY VISION LETHARGY     ROS: ROS  NEUROLOGIC EXAM: Awake, alert, oriented Memory and concentration grossly intact Speech fluent, appropriate CN grossly intact Motor exam: Upper  Extremities Deltoid Bicep Tricep Grip  Right 5/5 5/5 5/5 5/5  Left 5/5 5/5 5/5 5/5   Lower Extremity IP Quad PF DF EHL  Right 5/5 5/5 5/5 5/5 5/5  Left 5/5 5/5 5/5 5/5 5/5   Sensation grossly intact to LT  IMAGING: No new imaging  IMPRESSION: - 61 y.o. male with cervical spondylosis with radiculopathy.  He is neurologically intact.  PLAN: - C5-7 anterior discectomy with fusion and plate fixation.  We have discussed the risks, benefits, and alternatives to surgery and he wishes to proceed.

## 2016-09-26 NOTE — Transfer of Care (Signed)
Immediate Anesthesia Transfer of Care Note  Patient: Todd ConteLarry R Bowman  Procedure(s) Performed: Procedure(s) with comments: Anterior Cervical Five-Six/Six-Seven Decompression and Fusion (N/A) - C5-6 C6-7 Anterior discectomy with fusion and plate fixation  Patient Location: PACU  Anesthesia Type:General  Level of Consciousness: awake, alert , oriented and patient cooperative  Airway & Oxygen Therapy: Patient Spontanous Breathing and Patient connected to nasal cannula oxygen  Post-op Assessment: Report given to RN and Post -op Vital signs reviewed and stable  Post vital signs: Reviewed and stable  Last Vitals:  Vitals:   09/26/16 1240 09/26/16 1659  BP: (!) 155/72   Pulse: 62   Resp: 20   Temp: 37.1 C 36.5 C    Last Pain:  Vitals:   09/26/16 1659  TempSrc:   PainSc: 5          Complications: No apparent anesthesia complications

## 2016-09-26 NOTE — Op Note (Signed)
09/26/2016  4:44 PM  PATIENT:  Todd ConteLarry R Stringfellow  61 y.o. 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 Ditty, MD  ASSISTANTS: Cindra PresumeVincent Costella, PA-C  ANESTHESIA:   General  DRAINS: Medium hemovac   SPECIMEN:  None  INDICATION FOR PROCEDURE: [age] year old male with cervical radiculopathy.  I recommended the above procedure. Patient understood the risks, benefits, and alternatives and potential outcomes and wished to proceed.  PROCEDURE DETAILS: Patient was brought to the operating room placed under general endotracheal anesthesia. Patient was placed in the supine position on the operating room table. The neck was prepped with betadine and chloraprep and draped in a sterile fashion.   A transverse incision was made on the right side of the neck. Dissection was carried down thru the subcutaneous tissue and the platysma was elevated, opened vertically, and undermined with Metzenbaum scissors. Dissection was then carried out thru an avascular plane leaving the sternocleidomastoid, carotid artery, and jugular vein laterally and the trachea and esophagus medially. The ventral aspect of the vertebral column was identified and a localizing x-ray was taken. The C5-6 level was identified. The longus colli muscles were then elevated and the retractor was placed. The annulus was incised and the disc space entered. Discectomy was performed with micro-curettes and pituitary and kerrison rongeurs. I then used the high-speed drill to drill the endplates down to the level of the posterior longitudinal ligament. The operating microscope was draped and brought into the field provided additional magnification, illumination and visualization. Utilizing microsurgical technique, discectomy was continued posteriorly thru the  disc space. Posterior longitudinal ligament was opened with a nerve hook, and then removed along with disc herniation and osteophytes, decompressing the spinal canal and thecal sac. We then continued to remove osteophytic overgrowth and disc material decompressing the neural foramina and exiting nerve roots bilaterally. So by both visualization and palpation we felt we had an adequate decompression of the neural elements. We then measured the height of the intravertebral disc space and selected a trabecular metal interbody cage packed with equivabone. It was then gently positioned in the intravertebral disc space and countersunk.   The superior distraction pin was then removed and bone bleeding was stopped with bone wax. The distraction pin was inserted at the inferior level. The annulus was incised and the disc space entered. Discectomy was performed with micro-curettes and pituitary and kerrison rongeurs. I then used the high-speed drill to drill the endplates down to the level of the posterior longitudinal ligament. The operating microscope was returned to the field.  The discectomy was continued posteriorly thru the disc space. Posterior longitudinal ligament was opened with a nerve hook, and then removed along with disc herniation and osteophytes, decompressing the spinal canal and thecal sac. We then continued to remove osteophytic overgrowth and disc material decompressing the neural foramina and exiting nerve roots bilaterally. So by both visualization and palpation we felt we had an adequate decompression of the neural elements. We then measured the height of the intravertebral disc space and selected a second trabecular metal interbody cage which was packed with equivabone. It was then gently positioned in the intravertebral disc space and countersunk.   I then inserted a titanium plate and placed four variable angle screws into the two superior vertebral bodies and two fixed angle screws at the  inferior level and locked  them into position. The wound was irrigated with bacitracin solution, checked for hemostasis which was established and confirmed. Once meticulous hemostasis was achieved a medium hemovac drain was placed. The platysma was closed with interrupted 3-0 undyed Vicryl suture, the subcuticular layer was closed with running monocryl suture. The skin edges were approximated with dermabond. The drapes were removed. A sterile dressing was applied. The patient was then awakened from general anesthesia and transferred to the recovery room in stable condition. At the end of the procedure all sponge, needle and instrument counts were correct.   PATIENT DISPOSITION:  PACU - hemodynamically stable.   Delay start of Pharmacological VTE agent (>24hrs) due to surgical blood loss or risk of bleeding:  yes

## 2016-09-26 NOTE — Anesthesia Preprocedure Evaluation (Addendum)
Anesthesia Evaluation  Patient identified by MRN, date of birth, ID band Patient awake    Reviewed: Allergy & Precautions, NPO status , Patient's Chart, lab work & pertinent test results  Airway Mallampati: II  TM Distance: >3 FB     Dental   Pulmonary COPD, Current Smoker,    breath sounds clear to auscultation       Cardiovascular + CAD  + dysrhythmias  Rhythm:Regular Rate:Normal     Neuro/Psych  Headaches,    GI/Hepatic Neg liver ROS, GERD  ,  Endo/Other  negative endocrine ROS  Renal/GU negative Renal ROS     Musculoskeletal  (+) Arthritis ,   Abdominal   Peds  Hematology  (+) anemia ,   Anesthesia Other Findings   Reproductive/Obstetrics                            Anesthesia Physical Anesthesia Plan  ASA: III  Anesthesia Plan: General   Post-op Pain Management:    Induction: Intravenous  PONV Risk Score and Plan: 2 and Ondansetron and Dexamethasone  Airway Management Planned: Oral ETT  Additional Equipment:   Intra-op Plan:   Post-operative Plan: Extubation in OR  Informed Consent: I have reviewed the patients History and Physical, chart, labs and discussed the procedure including the risks, benefits and alternatives for the proposed anesthesia with the patient or authorized representative who has indicated his/her understanding and acceptance.   Dental advisory given  Plan Discussed with: CRNA and Anesthesiologist  Anesthesia Plan Comments:         Anesthesia Quick Evaluation

## 2016-09-27 ENCOUNTER — Encounter (HOSPITAL_COMMUNITY): Payer: Self-pay | Admitting: Neurological Surgery

## 2016-09-27 DIAGNOSIS — M4722 Other spondylosis with radiculopathy, cervical region: Secondary | ICD-10-CM | POA: Diagnosis not present

## 2016-09-27 MED ORDER — HYDROCODONE-ACETAMINOPHEN 7.5-325 MG PO TABS: 1.0000 | ORAL_TABLET | Freq: Four times a day (QID) | ORAL | 0 refills | Status: DC | PRN

## 2016-09-27 NOTE — Anesthesia Postprocedure Evaluation (Signed)
Anesthesia Post Note  Patient: Braulio ConteLarry R Lodes  Procedure(s) Performed: Procedure(s) (LRB): Anterior Cervical Five-Six/Six-Seven Decompression and Fusion (N/A)     Patient location during evaluation: PACU Anesthesia Type: General Level of consciousness: awake and alert Pain management: pain level controlled Vital Signs Assessment: post-procedure vital signs reviewed and stable Respiratory status: spontaneous breathing, nonlabored ventilation and respiratory function stable Cardiovascular status: blood pressure returned to baseline and stable Postop Assessment: no signs of nausea or vomiting Anesthetic complications: no    Last Vitals:  Vitals:   09/27/16 0400 09/27/16 0816  BP: 118/66 (!) 147/77  Pulse: (!) 59 65  Resp: 18 18  Temp: 36.7 C 36.6 C    Last Pain:  Vitals:   09/27/16 0904  TempSrc:   PainSc: 5                  Jaydence Arnesen,W. EDMOND

## 2016-09-27 NOTE — Evaluation (Signed)
Physical Therapy Evaluation Patient Details Name: Todd Bowman MRN: [id number] DOB: 11/11/1955 Today's Date: 09/27/2016   History of Present Illness  Pt is a 61 y.o. male s/p C5-6, C6-7 anterior discectomy with fusion and plate fixation. He has a PMH significant for anemia, anxiety, arthritis, chest pain, COPD, coronary artery disease, depression, dysrhythmia, GERD, and headache.   Clinical Impression  Pt presents with good functional mobility and minimal pain. Prior to admission pt was independent with all ADLs and mobility. Pt able to ambulated 311050ft without AD and completed 10 stairs with one rail and alternating step pattern. All education completed including maintaining spinal precautions during mobility, activity expectations, and pain management. No PT follow up recommended due to pt demonstrating good functional mobility, appropriate knowledge of precautions, and good caregiver support at home.     Follow Up Recommendations No PT follow up;Supervision - Intermittent    Equipment Recommendations  None recommended by PT    Recommendations for Other Services       Precautions / Restrictions Precautions Precautions: Cervical Precaution Comments: Educated pt concerning cervical precautions during ADL with handout provided.  Restrictions Weight Bearing Restrictions: No      Mobility  Bed Mobility Overal bed mobility: Needs Assistance Bed Mobility: Rolling;Sidelying to Sit Rolling: Supervision Sidelying to sit: Supervision       General bed mobility comments: Received sitting edge of bed. Discussed log rolling technique in order to maintain spinal precautions  Transfers Overall transfer level: Needs assistance Equipment used: None Transfers: Sit to/from Stand Sit to Stand: Supervision         General transfer comment: VC's for back precautions and safety.   Ambulation/Gait Ambulation/Gait assistance: Supervision Ambulation Distance (Feet): 350 Feet Assistive  device: None Gait Pattern/deviations: Step-through pattern   Gait velocity interpretation: Below normal speed for age/gender General Gait Details: Pt with slightly decreased cadence, but does not required any assistance.   Stairs Stairs: Yes Stairs assistance: Supervision Stair Management: One rail Left;Alternating pattern;Forwards Number of Stairs: 10 General stair comments: supervision for safety. Pt with no difficulty completing stairs and no reports of pain  Wheelchair Mobility    Modified Rankin (Stroke Patients Only)       Balance Overall balance assessment: No apparent balance deficits (not formally assessed)                                           Pertinent Vitals/Pain Pain Assessment: Faces Pain Score: 3  Faces Pain Scale: Hurts a little bit Pain Location: neck Pain Descriptors / Indicators: Operative site guarding;Sore Pain Intervention(s): Limited activity within patient's tolerance;Monitored during session    Home Living Family/patient expects to be discharged to:: Private residence Living Arrangements: Spouse/significant other Available Help at Discharge: Family;Available 24 hours/day Type of Home: House Home Access: Ramped entrance     Home Layout: One level Home Equipment: Cane - single point      Prior Function Level of Independence: Independent         Comments: Completely independent PTA.     Hand Dominance   Dominant Hand: Right    Extremity/Trunk Assessment   Upper Extremity Assessment Upper Extremity Assessment: Defer to OT evaluation    Lower Extremity Assessment Lower Extremity Assessment: Overall WFL for tasks assessed    Cervical / Trunk Assessment Cervical / Trunk Assessment: Other exceptions Cervical / Trunk Exceptions: s/p cervical surgery  Communication  Communication: No difficulties  Cognition Arousal/Alertness: Awake/alert Behavior During Therapy: WFL for tasks assessed/performed Overall  Cognitive Status: Within Functional Limits for tasks assessed                                        General Comments General comments (skin integrity, edema, etc.): PT provided education about maintaining spinal precautions, safety with mobility and car transfers, and activity expectations after d/c     Exercises     Assessment/Plan    PT Assessment Patent does not need any further PT services  PT Problem List         PT Treatment Interventions      PT Goals (Current goals can be found in the Care Plan section)  Acute Rehab PT Goals Patient Stated Goal: to go home PT Goal Formulation: With patient Time For Goal Achievement: 09/27/16 Potential to Achieve Goals: Good    Frequency     Barriers to discharge        Co-evaluation               AM-PAC PT "6 Clicks" Daily Activity  Outcome Measure Difficulty turning over in bed (including adjusting bedclothes, sheets and blankets)?: None Difficulty moving from lying on back to sitting on the side of the bed? : None Difficulty sitting down on and standing up from a chair with arms (e.g., wheelchair, bedside commode, etc,.)?: None Help needed moving to and from a bed to chair (including a wheelchair)?: None Help needed walking in hospital room?: A Little Help needed climbing 3-5 steps with a railing? : A Little 6 Click Score: 22    End of Session Equipment Utilized During Treatment: Gait belt Activity Tolerance: Patient tolerated treatment well Patient left: in bed;with call bell/phone within reach;with family/visitor present (sitting EOB) Nurse Communication: Mobility status PT Visit Diagnosis: Other symptoms and signs involving the nervous system (R29.898);Pain Pain - part of body:  (back)    Time: 4098-1191 PT Time Calculation (min) (ACUTE ONLY): 10 min   Charges:   PT Evaluation $PT Eval Low Complexity: 1 Procedure     PT G Codes:   PT G-Codes **NOT FOR INPATIENT CLASS** Functional  Assessment Tool Used: Clinical judgement Functional Limitation: Mobility: Walking and moving around Mobility: Walking and Moving Around Current Status (Y7829): At least 1 percent but less than 20 percent impaired, limited or restricted Mobility: Walking and Moving Around Goal Status [phone]): At least 1 percent but less than 20 percent impaired, limited or restricted Mobility: Walking and Moving Around Discharge Status [phone]): At least 1 percent but less than 20 percent impaired, limited or restricted    Corlis Leak, SPT  [phone]  Corlis Leak 09/27/2016, 9:27 AM

## 2016-09-27 NOTE — Progress Notes (Addendum)
Patient is discharged from room 3C10 at this time. Alert and in stable condition. IV site d/c'd and instructions read to patient with understanding verbalized. Left unit via wheelchair with wife and belongings at side.

## 2016-09-27 NOTE — Progress Notes (Signed)
Doing well Full strength Wound looks good D/c

## 2016-09-27 NOTE — Discharge Summary (Signed)
Physician Discharge Summary  Patient ID: Todd Bowman MRN: [id number] DOB/AGE: 1955/11/09 61 y.o.  Admit date: 09/26/2016 Discharge date: 09/27/2016  Admission Diagnoses:  Cervical spondylosis with radiculopathy  Discharge Diagnoses:  Same Active Problems:   Cervical spondylosis with radiculopathy   Discharged Condition: Stable  Hospital Course:  Todd Bowman is a 61 y.o. male who was admitted for the below procedure. There were no post operative complications. At time of discharge, pain was well controlled, ambulating with Pt/OT, tolerating po, voiding normal. Ready for discharge.  Treatments: Surgery C5-6, C6-7 anterior discectomy with fusion and plate fixation  Discharge Exam: Blood pressure 118/66, pulse (!) 59, temperature 98 F (36.7 C), temperature source Oral, resp. rate 18, height 5\' 7"  (1.702 m), weight 80.3 kg (177 lb), SpO2 94 %. Awake, alert, oriented Speech fluent, appropriate CN grossly intact 5/5 BUE/BLE Wound c/d/i  Disposition: Final discharge disposition not confirmed  Discharge Instructions    Call MD for:  difficulty breathing, headache or visual disturbances    Complete by:  As directed    Call MD for:  persistant dizziness or light-headedness    Complete by:  As directed    Call MD for:  redness, tenderness, or signs of infection (pain, swelling, redness, odor or green/yellow discharge around incision site)    Complete by:  As directed    Call MD for:  severe uncontrolled pain    Complete by:  As directed    Call MD for:  temperature >100.4    Complete by:  As directed    Diet general    Complete by:  As directed    Driving Restrictions    Complete by:  As directed    Do not drive until given clearance.   Increase activity slowly    Complete by:  As directed    Lifting restrictions    Complete by:  As directed    Do not lift anything >10lbs. Avoid bending and twisting in awkward positions. Avoid bending at the back.   May shower / Bathe     Complete by:  As directed    In 24 hours. Okay to wash wound with warm soapy water. Avoid scrubbing the wound. Pat dry.   Remove dressing in 24 hours    Complete by:  As directed      Allergies as of 09/27/2016      Reactions   Tramadol Itching, Rash, Other (See Comments)   HEADACHE BLURRY VISION LETHARGY       Medication List    TAKE these medications   albuterol 108 (90 Base) MCG/ACT inhaler Commonly known as:  PROVENTIL HFA;VENTOLIN HFA Inhale 1 puff into the lungs 4 (four) times daily. (scheduled per patient)   amLODipine 5 MG tablet Commonly known as:  NORVASC Take 5 mg by mouth daily.   aspirin EC 81 MG tablet Take 81 mg by mouth daily.   baclofen 20 MG tablet Commonly known as:  LIORESAL Take 20 mg by mouth 3 (three) times daily.   budesonide-formoterol 160-4.5 MCG/ACT inhaler Commonly known as:  SYMBICORT Inhale 2 puffs into the lungs 2 (two) times daily.   buPROPion 100 MG 12 hr tablet Commonly known as:  WELLBUTRIN SR Take 100 mg by mouth 2 (two) times daily.   diclofenac 75 MG EC tablet Commonly known as:  VOLTAREN Take 75 mg by mouth daily.   gabapentin 100 MG capsule Commonly known as:  NEURONTIN Take 300 mg by mouth at bedtime.   HYDROcodone-acetaminophen  7.5-325 MG tablet Commonly known as:  NORCO Take 1-2 tablets by mouth every 6 (six) hours as needed for moderate pain. What changed:  how much to take  when to take this  reasons to take this   montelukast 10 MG tablet Commonly known as:  SINGULAIR Take 10 mg by mouth at bedtime.   pantoprazole 40 MG tablet Commonly known as:  PROTONIX Take 40 mg by mouth daily before breakfast.   ranitidine 300 MG tablet Commonly known as:  ZANTAC Take 300 mg by mouth at bedtime.   simvastatin 20 MG tablet Commonly known as:  ZOCOR Take 20 mg by mouth at bedtime.      Follow-up Information    Ditty, Loura HaltBenjamin Jared, MD. Schedule an appointment as soon as possible for a visit in 3 week(s).    Specialty:  Neurosurgery Contact information: [address] [place] [address] [phone]           Signed: Alyson InglesCOSTELLA, Leata Dominy J 09/27/2016, 8:12 AM

## 2016-09-27 NOTE — Evaluation (Addendum)
Occupational Therapy Evaluation/Discharge Patient Details Name: Todd ConteLarry R Carreon MRN: [id number] DOB: 04/18/1955 Today's Date: 09/27/2016    History of Present Illness Pt is a 61 y.o. male s/p C5-6, C6-7 anterior discectomy with fusion and plate fixation. He has a PMH significant for anemia, anxiety, arthritis, chest pain, COPD, coronary artery disease, depression, dysrhythmia, GERD, and headache.    Clinical Impression   PTA, pt was independent with ADL and functional mobility and maintaining an active lifestyle. Pt currently able to complete all ADL with overall supervision for safety and VC's for adhering to cervical precautions at times. Educated pt on cervical precautions during ADL as well as compensatory strategies to adhere to these and he verbalizes and demonstrates good understanding. All education complete with handout provided and pt reports no further questions/concerns. He will have 24 hour assistance at home post-acute D/C. No further acute OT needs identified and OT will sign off.     Follow Up Recommendations  No OT follow up;Supervision/Assistance - 24 hour    Equipment Recommendations  None recommended by OT    Recommendations for Other Services       Precautions / Restrictions Precautions Precautions: Cervical Precaution Comments: Educated pt concerning cervical precautions during ADL with handout provided.  Restrictions Weight Bearing Restrictions: No      Mobility Bed Mobility Overal bed mobility: Needs Assistance Bed Mobility: Rolling;Sidelying to Sit Rolling: Supervision Sidelying to sit: Supervision       General bed mobility comments: Pt requiring VC's for log roll technique.   Transfers Overall transfer level: Needs assistance Equipment used: None Transfers: Sit to/from Stand Sit to Stand: Supervision         General transfer comment: VC's for back precautions and safety.     Balance Overall balance assessment: No apparent balance deficits  (not formally assessed)                                         ADL either performed or assessed with clinical judgement   ADL Overall ADL's : Needs assistance/impaired Eating/Feeding: Set up;Sitting   Grooming: Supervision/safety;Standing   Upper Body Bathing: Supervision/ safety;Sitting   Lower Body Bathing: Supervison/ safety;Sit to/from stand   Upper Body Dressing : Supervision/safety;Sitting   Lower Body Dressing: Supervision/safety;Sit to/from stand   Toilet Transfer: Supervision/safety;Ambulation;Cueing for safety;Comfort height toilet   Toileting- Clothing Manipulation and Hygiene: Supervision/safety;Sit to/from stand   Tub/ Shower Transfer: Supervision/safety;Ambulation   Functional mobility during ADLs: Supervision/safety General ADL Comments: Pt educated concerning cervical precautions during ADL as well as compensatory strategies to improve adherance to these during dressing, bathing, toileting, and grooming tasks including 2 cup method for oral care at sink and crossing foot over knee for LB ADL. Pt required VC's to maintain precautions during ADL.     Vision Patient Visual Report: No change from baseline Vision Assessment?: No apparent visual deficits     Perception     Praxis      Pertinent Vitals/Pain Pain Assessment: 0-10 Pain Score: 3  Pain Location: Neck Pain Descriptors / Indicators: Operative site guarding Pain Intervention(s): Limited activity within patient's tolerance;Repositioned;Monitored during session     Hand Dominance Right   Extremity/Trunk Assessment Upper Extremity Assessment Upper Extremity Assessment: Overall WFL for tasks assessed   Lower Extremity Assessment Lower Extremity Assessment: Overall WFL for tasks assessed   Cervical / Trunk Assessment Cervical / Trunk Assessment: Other exceptions Cervical /  Trunk Exceptions: s/p cervical surgery   Communication Communication Communication: No difficulties    Cognition Arousal/Alertness: Awake/alert Behavior During Therapy: WFL for tasks assessed/performed Overall Cognitive Status: Within Functional Limits for tasks assessed                                     General Comments       Exercises     Shoulder Instructions      Home Living Family/patient expects to be discharged to:: Private residence Living Arrangements: Spouse/significant other Available Help at Discharge: Family;Available 24 hours/day Type of Home: House Home Access: Ramped entrance     Home Layout: One level     Bathroom Shower/Tub: Chief Strategy Officer: Handicapped height     Home Equipment: Cane - single point          Prior Functioning/Environment Level of Independence: Independent        Comments: Completely independent PTA.        OT Problem List: Decreased knowledge of precautions;Decreased knowledge of use of DME or AE;Pain      OT Treatment/Interventions:      OT Goals(Current goals can be found in the care plan section) Acute Rehab OT Goals Patient Stated Goal: to go home OT Goal Formulation: With patient Time For Goal Achievement: 10/11/16 Potential to Achieve Goals: Good  OT Frequency:     Barriers to D/C:            Co-evaluation              AM-PAC PT "6 Clicks" Daily Activity     Outcome Measure Help from another person eating meals?: None Help from another person taking care of personal grooming?: A Little Help from another person toileting, which includes using toliet, bedpan, or urinal?: A Little Help from another person bathing (including washing, rinsing, drying)?: A Little Help from another person to put on and taking off regular upper body clothing?: A Little Help from another person to put on and taking off regular lower body clothing?: A Little 6 Click Score: 19   End of Session Nurse Communication: Mobility status  Activity Tolerance: Patient tolerated treatment  well Patient left: in bed;with call bell/phone within reach;with family/visitor present (sitting at EOB)  OT Visit Diagnosis: Other abnormalities of gait and mobility (R26.89);Pain Pain - Right/Left: Right (neck) Pain - part of body:  (neck)                Time: 1610-9604 OT Time Calculation (min): 15 min Charges:  OT General Charges $OT Visit: 1 Procedure OT Evaluation $OT Eval Moderate Complexity: 1 Procedure G-Codes: OT G-codes **NOT FOR INPATIENT CLASS** Functional Assessment Tool Used: Clinical judgement Functional Limitation: Self care Self Care Current Status (V4098): At least 1 percent but less than 20 percent impaired, limited or restricted Self Care Goal Status (J1914): At least 1 percent but less than 20 percent impaired, limited or restricted Self Care Discharge Status [phone]): At least 1 percent but less than 20 percent impaired, limited or restricted   Doristine Section, MS OTR/L  Pager: [phone]   Annarose Ouellet A Airen Dales 09/27/2016, 8:49 AM

## 2017-02-01 ENCOUNTER — Other Ambulatory Visit: Payer: Self-pay | Admitting: Neurological Surgery

## 2017-02-02 ENCOUNTER — Encounter (HOSPITAL_COMMUNITY): Payer: Self-pay | Admitting: *Deleted

## 2017-02-02 NOTE — Progress Notes (Signed)
Spoke with pt for pre-op call. Pt has hx of mild to moderate coronary heart disease without blockage per cath in 2015. Pt denies any recent chest pain. Pt states he is not diabetic.

## 2017-02-03 ENCOUNTER — Encounter (HOSPITAL_COMMUNITY)
Admission: RE | Disposition: A | Discharge status: Home / Self Care | Payer: Self-pay | Source: Ambulatory Visit | Attending: Neurological Surgery

## 2017-02-03 ENCOUNTER — Encounter (HOSPITAL_COMMUNITY): Payer: Self-pay | Admitting: Certified Registered Nurse Anesthetist

## 2017-02-03 SURGERY — ANTERIOR CERVICAL DECOMPRESSION/DISCECTOMY FUSION 1 LEVEL/HARDWARE REMOVAL
Anesthesia: General

## 2017-02-06 ENCOUNTER — Other Ambulatory Visit: Payer: Self-pay | Admitting: Neurological Surgery

## 2017-02-08 ENCOUNTER — Encounter (HOSPITAL_COMMUNITY): Payer: Self-pay | Admitting: *Deleted

## 2017-02-08 NOTE — Progress Notes (Signed)
Spoke with pt for pre-op call. Just talked with pt last week for same and surgery was rescheduled. He stated that nothing has changed with his allergies, medications, medical and surgical history. Pt denies any recent chest pain or sob.

## 2017-02-10 ENCOUNTER — Ambulatory Visit (HOSPITAL_COMMUNITY): Payer: Medicare HMO

## 2017-02-10 ENCOUNTER — Ambulatory Visit (HOSPITAL_COMMUNITY): Admission: RE | Admit: 2017-02-10 | Payer: Medicare HMO | Source: Ambulatory Visit | Admitting: Neurological Surgery

## 2017-02-10 ENCOUNTER — Encounter (HOSPITAL_COMMUNITY)
Admission: RE | Disposition: A | Discharge status: Home / Self Care | Payer: Self-pay | Source: Ambulatory Visit | Attending: Neurological Surgery

## 2017-02-10 ENCOUNTER — Encounter (HOSPITAL_COMMUNITY): Payer: Self-pay | Admitting: Anesthesiology

## 2017-02-10 ENCOUNTER — Encounter (HOSPITAL_COMMUNITY): Payer: Self-pay | Admitting: *Deleted

## 2017-02-10 ENCOUNTER — Ambulatory Visit (HOSPITAL_COMMUNITY)
Admission: RE | Admit: 2017-02-10 | Discharge: 2017-02-10 | Disposition: A | Discharge status: Home / Self Care | Payer: Medicare HMO | Source: Ambulatory Visit | Attending: Neurological Surgery | Admitting: Neurological Surgery

## 2017-02-10 DIAGNOSIS — Z419 Encounter for procedure for purposes other than remedying health state, unspecified: Secondary | ICD-10-CM

## 2017-02-10 DIAGNOSIS — K219 Gastro-esophageal reflux disease without esophagitis: Secondary | ICD-10-CM | POA: Diagnosis not present

## 2017-02-10 DIAGNOSIS — G2581 Restless legs syndrome: Secondary | ICD-10-CM | POA: Insufficient documentation

## 2017-02-10 DIAGNOSIS — Y793 Surgical instruments, materials and orthopedic devices (including sutures) associated with adverse incidents: Secondary | ICD-10-CM | POA: Diagnosis not present

## 2017-02-10 DIAGNOSIS — R1310 Dysphagia, unspecified: Secondary | ICD-10-CM | POA: Diagnosis not present

## 2017-02-10 DIAGNOSIS — J449 Chronic obstructive pulmonary disease, unspecified: Secondary | ICD-10-CM | POA: Insufficient documentation

## 2017-02-10 DIAGNOSIS — Z981 Arthrodesis status: Secondary | ICD-10-CM | POA: Insufficient documentation

## 2017-02-10 DIAGNOSIS — Z7982 Long term (current) use of aspirin: Secondary | ICD-10-CM | POA: Diagnosis not present

## 2017-02-10 DIAGNOSIS — M1990 Unspecified osteoarthritis, unspecified site: Secondary | ICD-10-CM | POA: Diagnosis not present

## 2017-02-10 DIAGNOSIS — I10 Essential (primary) hypertension: Secondary | ICD-10-CM | POA: Diagnosis not present

## 2017-02-10 DIAGNOSIS — Z79899 Other long term (current) drug therapy: Secondary | ICD-10-CM | POA: Insufficient documentation

## 2017-02-10 DIAGNOSIS — T84226A Displacement of internal fixation device of vertebrae, initial encounter: Secondary | ICD-10-CM | POA: Diagnosis present

## 2017-02-10 DIAGNOSIS — F17210 Nicotine dependence, cigarettes, uncomplicated: Secondary | ICD-10-CM | POA: Diagnosis not present

## 2017-02-10 DIAGNOSIS — F419 Anxiety disorder, unspecified: Secondary | ICD-10-CM | POA: Diagnosis not present

## 2017-02-10 DIAGNOSIS — Z885 Allergy status to narcotic agent status: Secondary | ICD-10-CM | POA: Diagnosis not present

## 2017-02-10 DIAGNOSIS — I2510 Atherosclerotic heart disease of native coronary artery without angina pectoris: Secondary | ICD-10-CM | POA: Insufficient documentation

## 2017-02-10 DIAGNOSIS — M4722 Other spondylosis with radiculopathy, cervical region: Secondary | ICD-10-CM | POA: Insufficient documentation

## 2017-02-10 DIAGNOSIS — F329 Major depressive disorder, single episode, unspecified: Secondary | ICD-10-CM | POA: Insufficient documentation

## 2017-02-10 HISTORY — PX: ANTERIOR CERVICAL DECOMP/DISCECTOMY FUSION: SHX1161

## 2017-02-10 HISTORY — DX: Essential (primary) hypertension: I10

## 2017-02-10 HISTORY — DX: Restless legs syndrome: G25.81

## 2017-02-10 LAB — BASIC METABOLIC PANEL
Anion gap: 8 (ref 5–15)
BUN: 11 mg/dL (ref 6–20)
CALCIUM: 9.2 mg/dL (ref 8.9–10.3)
CO2: 22 mmol/L (ref 22–32)
Chloride: 104 mmol/L (ref 101–111)
Creatinine, Ser: 0.95 mg/dL (ref 0.61–1.24)
GFR calc Af Amer: >60 mL/min (ref >60)
GLUCOSE: 96 mg/dL (ref 65–99)
Potassium: 3.9 mmol/L (ref 3.5–5.1)
Sodium: 134 mmol/L — ABNORMAL LOW (ref 135–145)

## 2017-02-10 LAB — CBC
HCT: 43.3 % (ref 39.0–52.0)
Hemoglobin: 14.9 g/dL (ref 13.0–17.0)
MCH: 32 pg (ref 26.0–34.0)
MCHC: 34.4 g/dL (ref 30.0–36.0)
MCV: 93.1 fL (ref 78.0–100.0)
Platelets: 257 10*3/uL (ref 150–400)
RBC: 4.65 MIL/uL (ref 4.22–5.81)
RDW: 13.3 % (ref 11.5–15.5)
WBC: 11.3 10*3/uL — ABNORMAL HIGH (ref 4.0–10.5)

## 2017-02-10 SURGERY — ANTERIOR CERVICAL DECOMPRESSION/DISCECTOMY FUSION 1 LEVEL
Anesthesia: General

## 2017-02-10 MED ORDER — BUPROPION HCL ER (SR) 150 MG PO TB12: 150.0000 mg | ORAL_TABLET | Freq: Two times a day (BID) | ORAL | Status: DC

## 2017-02-10 MED ORDER — GABAPENTIN 400 MG PO CAPS: 400.0000 mg | ORAL_CAPSULE | Freq: Three times a day (TID) | ORAL | Status: DC

## 2017-02-10 MED ORDER — DIAZEPAM 5 MG PO TABS: 5.0000 mg | ORAL_TABLET | Freq: Four times a day (QID) | ORAL | Status: DC | PRN

## 2017-02-10 MED ORDER — LACTATED RINGERS IV SOLN
INTRAVENOUS | Status: DC
  Administered 2017-02-10 (×3): via INTRAVENOUS

## 2017-02-10 MED ORDER — MIDAZOLAM HCL 2 MG/2ML IJ SOLN
INTRAMUSCULAR | Status: AC
  Filled 2017-02-10: qty 2

## 2017-02-10 MED ORDER — ONDANSETRON HCL 4 MG/2ML IJ SOLN
INTRAMUSCULAR | Status: AC
  Filled 2017-02-10: qty 2

## 2017-02-10 MED ORDER — ALBUTEROL SULFATE (2.5 MG/3ML) 0.083% IN NEBU: 2.5000 mg | INHALATION_SOLUTION | Freq: Four times a day (QID) | RESPIRATORY_TRACT | Status: DC

## 2017-02-10 MED ORDER — SODIUM CHLORIDE 0.9% FLUSH: 3.0000 mL | INTRAVENOUS | Status: DC | PRN

## 2017-02-10 MED ORDER — ROCURONIUM BROMIDE 100 MG/10ML IV SOLN
INTRAVENOUS | Status: DC | PRN
  Administered 2017-02-10: 50 mg via INTRAVENOUS

## 2017-02-10 MED ORDER — SENNA 8.6 MG PO TABS: 1.0000 | ORAL_TABLET | Freq: Two times a day (BID) | ORAL | Status: DC

## 2017-02-10 MED ORDER — OXYCODONE HCL ER 15 MG PO T12A: 15.0000 mg | EXTENDED_RELEASE_TABLET | Freq: Two times a day (BID) | ORAL | Status: DC

## 2017-02-10 MED ORDER — GABAPENTIN 300 MG PO CAPS: 300.0000 mg | ORAL_CAPSULE | Freq: Three times a day (TID) | ORAL | Status: DC

## 2017-02-10 MED ORDER — ONDANSETRON HCL 4 MG/2ML IJ SOLN: 4.0000 mg | Freq: Four times a day (QID) | INTRAMUSCULAR | Status: DC | PRN

## 2017-02-10 MED ORDER — CEFAZOLIN SODIUM-DEXTROSE 2-4 GM/100ML-% IV SOLN
INTRAVENOUS | Status: AC
  Filled 2017-02-10: qty 100

## 2017-02-10 MED ORDER — CEFAZOLIN SODIUM-DEXTROSE 2-4 GM/100ML-% IV SOLN
2.0000 g | INTRAVENOUS | Status: AC
  Administered 2017-02-10: 2 g via INTRAVENOUS

## 2017-02-10 MED ORDER — BUPIVACAINE-EPINEPHRINE (PF) 0.5% -1:200000 IJ SOLN
INTRAMUSCULAR | Status: AC
  Filled 2017-02-10: qty 30

## 2017-02-10 MED ORDER — ACETAMINOPHEN 325 MG PO TABS: 650.0000 mg | ORAL_TABLET | ORAL | Status: DC | PRN

## 2017-02-10 MED ORDER — LIDOCAINE-EPINEPHRINE 2 %-1:100000 IJ SOLN
INTRAMUSCULAR | Status: AC
  Filled 2017-02-10: qty 1

## 2017-02-10 MED ORDER — LIDOCAINE HCL (CARDIAC) 20 MG/ML IV SOLN: INTRAVENOUS | Status: DC | PRN

## 2017-02-10 MED ORDER — ALBUTEROL SULFATE (2.5 MG/3ML) 0.083% IN NEBU
INHALATION_SOLUTION | RESPIRATORY_TRACT | Status: AC
  Filled 2017-02-10: qty 3

## 2017-02-10 MED ORDER — ONDANSETRON HCL 4 MG PO TABS: 4.0000 mg | ORAL_TABLET | Freq: Four times a day (QID) | ORAL | Status: DC | PRN

## 2017-02-10 MED ORDER — FAMOTIDINE 20 MG PO TABS: 20.0000 mg | ORAL_TABLET | Freq: Every day | ORAL | Status: DC

## 2017-02-10 MED ORDER — DEXAMETHASONE SODIUM PHOSPHATE 10 MG/ML IJ SOLN
INTRAMUSCULAR | Status: AC
  Filled 2017-02-10: qty 1

## 2017-02-10 MED ORDER — PANTOPRAZOLE SODIUM 40 MG IV SOLR: 40.0000 mg | Freq: Every day | INTRAVENOUS | Status: DC

## 2017-02-10 MED ORDER — DOCUSATE SODIUM 100 MG PO CAPS: 100.0000 mg | ORAL_CAPSULE | Freq: Two times a day (BID) | ORAL | Status: DC

## 2017-02-10 MED ORDER — ONDANSETRON HCL 4 MG/2ML IJ SOLN: 4.0000 mg | Freq: Once | INTRAMUSCULAR | Status: DC | PRN

## 2017-02-10 MED ORDER — AMLODIPINE BESYLATE 5 MG PO TABS: 5.0000 mg | ORAL_TABLET | Freq: Every day | ORAL | Status: DC

## 2017-02-10 MED ORDER — CHLORHEXIDINE GLUCONATE CLOTH 2 % EX PADS: 6.0000 | MEDICATED_PAD | Freq: Once | CUTANEOUS | Status: DC

## 2017-02-10 MED ORDER — SODIUM CHLORIDE 0.9% FLUSH: 3.0000 mL | Freq: Two times a day (BID) | INTRAVENOUS | Status: DC

## 2017-02-10 MED ORDER — ACETAMINOPHEN 650 MG RE SUPP: 650.0000 mg | RECTAL | Status: DC | PRN

## 2017-02-10 MED ORDER — ZOLPIDEM TARTRATE 5 MG PO TABS: 5.0000 mg | ORAL_TABLET | Freq: Every evening | ORAL | Status: DC | PRN

## 2017-02-10 MED ORDER — BUPIVACAINE-EPINEPHRINE (PF) 0.5% -1:200000 IJ SOLN
INTRAMUSCULAR | Status: DC | PRN
  Administered 2017-02-10: 5 mL

## 2017-02-10 MED ORDER — LIDOCAINE 2% (20 MG/ML) 5 ML SYRINGE
INTRAMUSCULAR | Status: AC
  Filled 2017-02-10: qty 5

## 2017-02-10 MED ORDER — PROPOFOL 10 MG/ML IV BOLUS
INTRAVENOUS | Status: AC
  Filled 2017-02-10: qty 20

## 2017-02-10 MED ORDER — DEXAMETHASONE SODIUM PHOSPHATE 10 MG/ML IJ SOLN
INTRAMUSCULAR | Status: DC | PRN
  Administered 2017-02-10: 10 mg via INTRAVENOUS

## 2017-02-10 MED ORDER — ROCURONIUM BROMIDE 10 MG/ML (PF) SYRINGE
PREFILLED_SYRINGE | INTRAVENOUS | Status: AC
  Filled 2017-02-10: qty 5

## 2017-02-10 MED ORDER — ALBUTEROL SULFATE (2.5 MG/3ML) 0.083% IN NEBU
2.5000 mg | INHALATION_SOLUTION | Freq: Once | RESPIRATORY_TRACT | Status: AC
  Administered 2017-02-10: 2.5 mg via RESPIRATORY_TRACT

## 2017-02-10 MED ORDER — LIDOCAINE HCL (CARDIAC) 20 MG/ML IV SOLN
INTRAVENOUS | Status: DC | PRN
Start: 2017-02-10 — End: 2017-02-10
  Administered 2017-02-10: 40 mg via INTRAVENOUS

## 2017-02-10 MED ORDER — OXYCODONE HCL 5 MG PO TABS: 5.0000 mg | ORAL_TABLET | ORAL | Status: DC | PRN

## 2017-02-10 MED ORDER — LIDOCAINE-EPINEPHRINE 2 %-1:100000 IJ SOLN
INTRAMUSCULAR | Status: DC | PRN
  Administered 2017-02-10: 5 mL

## 2017-02-10 MED ORDER — SUGAMMADEX SODIUM 200 MG/2ML IV SOLN
INTRAVENOUS | Status: AC
  Filled 2017-02-10: qty 4

## 2017-02-10 MED ORDER — FENTANYL CITRATE (PF) 100 MCG/2ML IJ SOLN
25.0000 ug | INTRAMUSCULAR | Status: DC | PRN
  Administered 2017-02-10 (×4): 25 ug via INTRAVENOUS

## 2017-02-10 MED ORDER — GELATIN ABSORBABLE MT POWD
OROMUCOSAL | Status: DC | PRN
  Administered 2017-02-10: 14:00:00 via TOPICAL

## 2017-02-10 MED ORDER — MONTELUKAST SODIUM 10 MG PO TABS: 10.0000 mg | ORAL_TABLET | Freq: Every day | ORAL | Status: DC

## 2017-02-10 MED ORDER — FENTANYL CITRATE (PF) 100 MCG/2ML IJ SOLN
INTRAMUSCULAR | Status: AC
  Filled 2017-02-10: qty 2

## 2017-02-10 MED ORDER — PHENOL 1.4 % MT LIQD: 1.0000 | OROMUCOSAL | Status: DC | PRN

## 2017-02-10 MED ORDER — PANTOPRAZOLE SODIUM 40 MG PO TBEC: 40.0000 mg | DELAYED_RELEASE_TABLET | Freq: Every day | ORAL | Status: DC

## 2017-02-10 MED ORDER — ASPIRIN EC 81 MG PO TBEC: 81.0000 mg | DELAYED_RELEASE_TABLET | Freq: Every day | ORAL | Status: DC

## 2017-02-10 MED ORDER — SODIUM CHLORIDE 0.9 % IV SOLN: INTRAVENOUS | Status: DC

## 2017-02-10 MED ORDER — BISACODYL 10 MG RE SUPP: 10.0000 mg | Freq: Every day | RECTAL | Status: DC | PRN

## 2017-02-10 MED ORDER — CEFAZOLIN SODIUM-DEXTROSE 1-4 GM/50ML-% IV SOLN
1.0000 g | Freq: Three times a day (TID) | INTRAVENOUS | Status: DC
  Filled 2017-02-10 (×2): qty 50

## 2017-02-10 MED ORDER — SIMVASTATIN 20 MG PO TABS: 20.0000 mg | ORAL_TABLET | Freq: Every day | ORAL | Status: DC

## 2017-02-10 MED ORDER — FENTANYL CITRATE (PF) 100 MCG/2ML IJ SOLN
INTRAMUSCULAR | Status: DC | PRN
  Administered 2017-02-10 (×5): 50 ug via INTRAVENOUS

## 2017-02-10 MED ORDER — MENTHOL 3 MG MT LOZG: 1.0000 | LOZENGE | OROMUCOSAL | Status: DC | PRN

## 2017-02-10 MED ORDER — MIDAZOLAM HCL 5 MG/5ML IJ SOLN
INTRAMUSCULAR | Status: DC | PRN
Start: 2017-02-10 — End: 2017-02-10
  Administered 2017-02-10: 2 mg via INTRAVENOUS

## 2017-02-10 MED ORDER — ACETAMINOPHEN 500 MG PO TABS: 1000.0000 mg | ORAL_TABLET | Freq: Four times a day (QID) | ORAL | Status: DC

## 2017-02-10 MED ORDER — BACLOFEN 10 MG PO TABS: 20.0000 mg | ORAL_TABLET | Freq: Three times a day (TID) | ORAL | Status: DC

## 2017-02-10 MED ORDER — FENTANYL CITRATE (PF) 250 MCG/5ML IJ SOLN
INTRAMUSCULAR | Status: AC
  Filled 2017-02-10: qty 5

## 2017-02-10 MED ORDER — THROMBIN (RECOMBINANT) 5000 UNITS EX SOLR
CUTANEOUS | Status: AC
  Filled 2017-02-10: qty 10000

## 2017-02-10 MED ORDER — FLEET ENEMA 7-19 GM/118ML RE ENEM: 1.0000 | ENEMA | Freq: Once | RECTAL | Status: DC | PRN

## 2017-02-10 MED ORDER — SODIUM CHLORIDE 0.9 % IR SOLN
Status: DC | PRN
  Administered 2017-02-10: 14:00:00

## 2017-02-10 MED ORDER — PROPOFOL 10 MG/ML IV BOLUS
INTRAVENOUS | Status: DC | PRN
  Administered 2017-02-10: 170 mg via INTRAVENOUS

## 2017-02-10 MED ORDER — ALBUTEROL SULFATE (2.5 MG/3ML) 0.083% IN NEBU: 2.5000 mg | INHALATION_SOLUTION | Freq: Four times a day (QID) | RESPIRATORY_TRACT | Status: DC | PRN

## 2017-02-10 MED ORDER — SODIUM CHLORIDE 0.9 % IV SOLN: 250.0000 mL | INTRAVENOUS | Status: DC

## 2017-02-10 MED ORDER — 0.9 % SODIUM CHLORIDE (POUR BTL) OPTIME
TOPICAL | Status: DC | PRN
  Administered 2017-02-10: 1000 mL

## 2017-02-10 MED ORDER — TIZANIDINE HCL 4 MG PO TABS: 4.0000 mg | ORAL_TABLET | Freq: Three times a day (TID) | ORAL | Status: DC | PRN

## 2017-02-10 MED ORDER — CELECOXIB 200 MG PO CAPS: 200.0000 mg | ORAL_CAPSULE | Freq: Two times a day (BID) | ORAL | Status: DC

## 2017-02-10 MED ORDER — SUGAMMADEX SODIUM 200 MG/2ML IV SOLN
INTRAVENOUS | Status: DC | PRN
  Administered 2017-02-10: 154.2 mg via INTRAVENOUS

## 2017-02-10 MED ORDER — OXYCODONE HCL 5 MG PO TABS
10.0000 mg | ORAL_TABLET | ORAL | Status: DC | PRN
Start: 2017-02-10 — End: 2017-02-10

## 2017-02-10 SURGICAL SUPPLY — 56 items
BUR MATCHSTICK NEURO 3.0 LAGG (BURR) ×3 IMPLANT
CANISTER SUCT 3000ML PPV (MISCELLANEOUS) ×3 IMPLANT
CARTRIDGE OIL MAESTRO DRILL (MISCELLANEOUS) ×1 IMPLANT
CHLORAPREP W/TINT 26ML (MISCELLANEOUS) ×3 IMPLANT
DECANTER SPIKE VIAL GLASS SM (MISCELLANEOUS) ×3 IMPLANT
DERMABOND ADVANCED (GAUZE/BANDAGES/DRESSINGS) ×2
DERMABOND ADVANCED .7 DNX12 (GAUZE/BANDAGES/DRESSINGS) ×1 IMPLANT
DIFFUSER DRILL AIR PNEUMATIC (MISCELLANEOUS) ×3 IMPLANT
DRAPE C-ARM 42X72 X-RAY (DRAPES) ×6 IMPLANT
DRAPE HALF SHEET 40X57 (DRAPES) IMPLANT
DRAPE LAPAROTOMY 100X72 PEDS (DRAPES) ×3 IMPLANT
DRAPE MICROSCOPE LEICA (MISCELLANEOUS) IMPLANT
DRAPE POUCH INSTRU U-SHP 10X18 (DRAPES) ×3 IMPLANT
DRAPE SHEET LG 3/4 BI-LAMINATE (DRAPES) IMPLANT
DRSG OPSITE POSTOP 3X4 (GAUZE/BANDAGES/DRESSINGS) ×3 IMPLANT
ELECT COATED BLADE 2.86 ST (ELECTRODE) ×3 IMPLANT
ELECT REM PT RETURN 9FT ADLT (ELECTROSURGICAL) ×3
ELECTRODE REM PT RTRN 9FT ADLT (ELECTROSURGICAL) ×1 IMPLANT
EVACUATOR 1/8 PVC DRAIN (DRAIN) IMPLANT
GAUZE SPONGE 4X4 12PLY STRL (GAUZE/BANDAGES/DRESSINGS) IMPLANT
GAUZE SPONGE 4X4 16PLY XRAY LF (GAUZE/BANDAGES/DRESSINGS) IMPLANT
GLOVE BIO SURGEON STRL SZ7 (GLOVE) IMPLANT
GLOVE BIOGEL PI IND STRL 7.0 (GLOVE) IMPLANT
GLOVE BIOGEL PI IND STRL 7.5 (GLOVE) ×1 IMPLANT
GLOVE BIOGEL PI INDICATOR 7.0 (GLOVE)
GLOVE BIOGEL PI INDICATOR 7.5 (GLOVE) ×2
GOWN STRL REUS W/ TWL LRG LVL3 (GOWN DISPOSABLE) ×1 IMPLANT
GOWN STRL REUS W/ TWL XL LVL3 (GOWN DISPOSABLE) ×1 IMPLANT
GOWN STRL REUS W/TWL LRG LVL3 (GOWN DISPOSABLE) ×2
GOWN STRL REUS W/TWL XL LVL3 (GOWN DISPOSABLE) ×2
HEMOSTAT POWDER KIT SURGIFOAM (HEMOSTASIS) ×3 IMPLANT
KIT BASIN OR (CUSTOM PROCEDURE TRAY) ×3 IMPLANT
KIT ROOM TURNOVER OR (KITS) ×3 IMPLANT
NEEDLE HYPO 21X1.5 SAFETY (NEEDLE) ×3 IMPLANT
NEEDLE SPNL 18GX3.5 QUINCKE PK (NEEDLE) ×3 IMPLANT
NS IRRIG 1000ML POUR BTL (IV SOLUTION) ×3 IMPLANT
OIL CARTRIDGE MAESTRO DRILL (MISCELLANEOUS) ×3
PACK LAMINECTOMY NEURO (CUSTOM PROCEDURE TRAY) ×3 IMPLANT
PAD ARMBOARD 7.5X6 YLW CONV (MISCELLANEOUS) ×3 IMPLANT
PATTIES SURGICAL .5X1.5 (GAUZE/BANDAGES/DRESSINGS) ×3 IMPLANT
PIN DISTRACTION 14MM (PIN) IMPLANT
RUBBERBAND STERILE (MISCELLANEOUS) IMPLANT
SCREW SPINAL 4.2X16MM (Screw) ×3 IMPLANT
SPONGE INTESTINAL PEANUT (DISPOSABLE) ×3 IMPLANT
SPONGE SURGIFOAM ABS GEL SZ50 (HEMOSTASIS) IMPLANT
STAPLER VISISTAT 35W (STAPLE) ×3 IMPLANT
STOCKINETTE 6  STRL (DRAPES) ×2
STOCKINETTE 6 STRL (DRAPES) ×1 IMPLANT
SUT STRATAFIX MNCRL+ 3-0 PS-2 (SUTURE) ×1
SUT STRATAFIX MONOCRYL 3-0 (SUTURE) ×2
SUT VIC AB 3-0 SH 8-18 (SUTURE) ×6 IMPLANT
SUTURE STRATFX MNCRL+ 3-0 PS-2 (SUTURE) ×1 IMPLANT
SYR 30ML LL (SYRINGE) ×3 IMPLANT
TOWEL GREEN STERILE (TOWEL DISPOSABLE) ×3 IMPLANT
TOWEL GREEN STERILE FF (TOWEL DISPOSABLE) ×6 IMPLANT
WATER STERILE IRR 1000ML POUR (IV SOLUTION) ×3 IMPLANT

## 2017-02-10 NOTE — H&P (Signed)
CC:  No chief complaint on file. Dysphagia  HPI: Todd Bowman is a 61 y.o. male who underwent a C5-7 ACDF approximatelBraulio Bowman three months ago.  He returned to clinic complaining of dysphagia and his Xrays showed that one of the C7 screws was backing out.  I recommended revision of the screw with possible replacement.  He presents for that.  PMH: Past Medical History:  Diagnosis Date  . Anemia   . Anxiety   . Arthritis   . Chest pain    had work-up including 09/01/16 ([place]) LHC showing non-obstructive CAD.   Marland Kitchen. COPD (chronic obstructive pulmonary disease) (HCC)   . Coronary artery disease    mild to moderate non-obstructive per cath   . Depression   . Dysrhythmia    "irregular heart beat"  . GERD (gastroesophageal reflux disease)   . Headache    daily - due to head injury in 1988  . Hypertension   . Restless legs     PSH: Past Surgical History:  Procedure Laterality Date  . ANTERIOR CERVICAL DECOMP/DISCECTOMY FUSION N/A 09/26/2016   Procedure: Anterior Cervical Five-Six/Six-Seven Decompression and Fusion;  Surgeon: Todd Bowman, Todd HaltBenjamin Jared, Todd Bowman;  Location: [facility] OR;  Service: Neurosurgery;  Laterality: N/A;  C5-6 C6-7 Anterior discectomy with fusion and plate fixation  . CARDIAC CATHETERIZATION     "mild to moderate nonubstructive coronary artery disease involving all vessels" '15; 09/01/16 LHC ([place]): 40% mLAD, 40% pD1, 20% oLCx, 20-30% prox-mid RCA, 20% dRCA, EF 60%. Medical Rx (Dr. Darlyn Chambereepak Banerjee)  . COLONOSCOPY     polyps removed  . TONSILLECTOMY      SH: Social History  Substance Use Topics  . Smoking status: Current Every Day Smoker    Packs/day: 1.50    Years: 50.00    Types: Cigarettes  . Smokeless tobacco: Never Used  . Alcohol use No    MEDS: Prior to Admission medications   Medication Sig Start Date End Date Taking? Authorizing Provider  albuterol (PROVENTIL HFA;VENTOLIN HFA) 108 (90 Base) MCG/ACT inhaler Inhale 1 puff into the lungs 4 (four) times  daily. (scheduled per patient)   Yes Provider, Historical, Todd Bowman  amLODipine (NORVASC) 5 MG tablet Take 5 mg by mouth daily.   Yes Provider, Historical, Todd Bowman  aspirin EC 81 MG tablet Take 81 mg by mouth daily.   Yes Provider, Historical, Todd Bowman  baclofen (LIORESAL) 20 MG tablet Take 20 mg by mouth 3 (three) times daily.   Yes Provider, Historical, Todd Bowman  buPROPion (WELLBUTRIN SR) 150 MG 12 hr tablet Take 150 mg by mouth 2 (two) times daily.   Yes Provider, Historical, Todd Bowman  cyclobenzaprine (FLEXERIL) 10 MG tablet Take 10 mg by mouth 3 (three) times daily.   Yes Provider, Historical, Todd Bowman  diclofenac (VOLTAREN) 75 MG EC tablet Take 75 mg by mouth daily.    Yes Provider, Historical, Todd Bowman  gabapentin (NEURONTIN) 400 MG capsule Take 400 mg by mouth 3 (three) times daily.   Yes Provider, Historical, Todd Bowman  montelukast (SINGULAIR) 10 MG tablet Take 10 mg by mouth daily.    Yes Provider, Historical, Todd Bowman  oxyCODONE-acetaminophen (PERCOCET) 7.5-325 MG tablet Take 1 tablet by mouth every 6 (six) hours as needed for severe pain.   Yes Provider, Historical, Todd Bowman  pantoprazole (PROTONIX) 40 MG tablet Take 40 mg by mouth daily before breakfast.    Yes Provider, Historical, Todd Bowman  ranitidine (ZANTAC) 300 MG tablet Take 300 mg by mouth at bedtime. 06/09/16  Yes Provider, Historical, Todd Bowman  simvastatin (ZOCOR) 20  MG tablet Take 20 mg by mouth at bedtime.   Yes Provider, Historical, Todd Bowman  tiZANidine (ZANAFLEX) 4 MG tablet Take 4 mg by mouth every 8 (eight) hours as needed for muscle spasms.   Yes Provider, Historical, Todd Bowman  HYDROcodone-acetaminophen (NORCO/VICODIN) 5-325 MG tablet Take 1-2 tablets by mouth every 8 (eight) hours.     Provider, Historical, Todd Bowman    ALLERGY: Allergies  Allergen Reactions  . Tramadol Itching, Rash and Other (See Comments)    HEADACHE BLURRY VISION LETHARGY     ROS: ROS  NEUROLOGIC EXAM: Awake, alert, oriented Memory and concentration grossly intact Speech fluent, appropriate CN grossly intact Motor  exam: Upper Extremities Deltoid Bicep Tricep Grip  Right 5/5 5/5 5/5 5/5  Left 5/5 5/5 5/5 5/5   Lower Extremity IP Quad PF DF EHL  Right 5/5 5/5 5/5 5/5 5/5  Left 5/5 5/5 5/5 5/5 5/5   Sensation grossly intact to LT  IMAGING: No new imaging  IMPRESSION: - 61 y.o. male with dysphagia due to a backed out C7 anterior cervical plate screw.  He is neurologically intact.  PLAN: - Revision of C7 screw.  We have discussed the risks, benefits, and alternatives to surgery and he wishes to proceed. - Possibly home this evening after surgery.

## 2017-02-10 NOTE — Transfer of Care (Signed)
Immediate Anesthesia Transfer of Care Note  Patient: Braulio ConteLarry R Fackrell  Procedure(s) Performed: Removal and possible replacement of cervical 7 screw (N/A )  Patient Location: PACU  Anesthesia Type:General  Level of Consciousness: awake and alert   Airway & Oxygen Therapy: Patient Spontanous Breathing  Post-op Assessment: Report given to RN and Post -op Vital signs reviewed and stable  Post vital signs: Reviewed and stable  Last Vitals:  Vitals:   02/10/17 1327 02/10/17 1533  BP: (!) 154/81   Pulse: 62   Resp: 20   Temp: 36.6 C (P) 36.5 C  SpO2: 97%     Last Pain:  Vitals:   02/10/17 1327  TempSrc: Oral  PainSc:       Patients Stated Pain Goal: 3 (02/10/17 1326)  Complications: No apparent anesthesia complications

## 2017-02-10 NOTE — Discharge Summary (Signed)
Physician Discharge Summary  Patient ID: Todd Bowman MRN: [id number] DOB/AGE: [birth year] 61 y.o.  Admit date: 02/10/2017 Discharge date: 02/10/2017  Admission Diagnoses:  dysphagia  Discharge Diagnoses:  Same Active Problems:   Dysphagia   Discharged Condition: Stable  Hospital Course:  ZAHMIR LALLA is a 61 y.o. male who was admitted for the below same-day procedure. There were no post operative complications. At time of discharge, pain was well controlled, ambulating, tolerating po, voiding normal. Ready for discharge.   Treatments: Surgery - Other Spondylosis with radiculopathy, Cervical region   Discharge Exam: Blood pressure 136/77, pulse 77, temperature 97.7 F (36.5 C), temperature source Oral, resp. rate 16, height 5\' 7"  (1.702 m), weight 77.1 kg (170 lb), SpO2 97 %. Awake, alert, oriented Speech fluent, appropriate CN grossly intact 5/5 BUE/BLE Wound c/d/i  Disposition: 01-Home or Self Care  Discharge Instructions    Call MD for:  difficulty breathing, headache or visual disturbances    Complete by:  As directed    Call MD for:  persistant dizziness or light-headedness    Complete by:  As directed    Call MD for:  redness, tenderness, or signs of infection (pain, swelling, redness, odor or green/yellow discharge around incision site)    Complete by:  As directed    Call MD for:  severe uncontrolled pain    Complete by:  As directed    Call MD for:  temperature >100.4    Complete by:  As directed    Diet general    Complete by:  As directed    Driving Restrictions    Complete by:  As directed    Do not drive until given clearance.   Increase activity slowly    Complete by:  As directed    Lifting restrictions    Complete by:  As directed    Do not lift anything >10lbs. Avoid bending and twisting in awkward positions. Avoid bending at the back.   May shower / Bathe    Complete by:  As directed    In 24 hours. Okay to wash wound with warm soapy water.  Avoid scrubbing the wound. Pat dry.   Remove dressing in 24 hours    Complete by:  As directed      Allergies as of 02/10/2017      Reactions   Tramadol Itching, Rash, Other (See Comments)   HEADACHE BLURRY VISION LETHARGY       Medication List    STOP taking these medications   HYDROcodone-acetaminophen 5-325 MG tablet Commonly known as:  NORCO/VICODIN     TAKE these medications   albuterol 108 (90 Base) MCG/ACT inhaler Commonly known as:  PROVENTIL HFA;VENTOLIN HFA Inhale 1 puff into the lungs 4 (four) times daily. (scheduled per patient)   amLODipine 5 MG tablet Commonly known as:  NORVASC Take 5 mg by mouth daily.   aspirin EC 81 MG tablet Take 81 mg by mouth daily.   baclofen 20 MG tablet Commonly known as:  LIORESAL Take 20 mg by mouth 3 (three) times daily.   buPROPion 150 MG 12 hr tablet Commonly known as:  WELLBUTRIN SR Take 150 mg by mouth 2 (two) times daily.   cyclobenzaprine 10 MG tablet Commonly known as:  FLEXERIL Take 10 mg by mouth 3 (three) times daily.   diclofenac 75 MG EC tablet Commonly known as:  VOLTAREN Take 75 mg by mouth daily.   gabapentin 400 MG capsule Commonly known as:  NEURONTIN Take  400 mg by mouth 3 (three) times daily.   montelukast 10 MG tablet Commonly known as:  SINGULAIR Take 10 mg by mouth daily.   oxyCODONE-acetaminophen 7.5-325 MG tablet Commonly known as:  PERCOCET Take 1 tablet by mouth every 6 (six) hours as needed for severe pain.   pantoprazole 40 MG tablet Commonly known as:  PROTONIX Take 40 mg by mouth daily before breakfast.   ranitidine 300 MG tablet Commonly known as:  ZANTAC Take 300 mg by mouth at bedtime.   simvastatin 20 MG tablet Commonly known as:  ZOCOR Take 20 mg by mouth at bedtime.   tiZANidine 4 MG tablet Commonly known as:  ZANAFLEX Take 4 mg by mouth every 8 (eight) hours as needed for muscle spasms.      Follow-up Information    Ditty, Loura HaltBenjamin Jared, MD. Schedule an  appointment as soon as possible for a visit in 3 week(s).   Specialty:  Neurosurgery Contact information: [address] [place] [address] [phone]           Signed: Alyson InglesCOSTELLA, VINCENT J 02/10/2017, 6:09 PM

## 2017-02-10 NOTE — Anesthesia Preprocedure Evaluation (Signed)
Anesthesia Evaluation  Patient identified by MRN, date of birth, ID band Patient awake    Reviewed: Allergy & Precautions, NPO status , Patient's Chart, lab work & pertinent test results  Airway Mallampati: II  TM Distance: >3 FB     Dental  (+) Poor Dentition   Pulmonary Current Smoker,    breath sounds clear to auscultation       Cardiovascular hypertension,  Rhythm:Regular Rate:Normal     Neuro/Psych    GI/Hepatic   Endo/Other    Renal/GU      Musculoskeletal   Abdominal   Peds  Hematology   Anesthesia Other Findings   Reproductive/Obstetrics                             Anesthesia Physical Anesthesia Plan  ASA: III  Anesthesia Plan: General   Post-op Pain Management:    Induction: Intravenous  PONV Risk Score and Plan: Ondansetron and Dexamethasone  Airway Management Planned: Oral ETT  Additional Equipment:   Intra-op Plan:   Post-operative Plan: Extubation in OR  Informed Consent: I have reviewed the patients History and Physical, chart, labs and discussed the procedure including the risks, benefits and alternatives for the proposed anesthesia with the patient or authorized representative who has indicated his/her understanding and acceptance.   Dental advisory given  Plan Discussed with: CRNA and Anesthesiologist  Anesthesia Plan Comments:         Anesthesia Quick Evaluation

## 2017-02-10 NOTE — Anesthesia Procedure Notes (Addendum)
Procedure Name: Intubation Date/Time: 02/10/2017 2:20 PM Performed by: Scheryl Darter Pre-anesthesia Checklist: Patient identified, Emergency Drugs available, Suction available and Patient being monitored Patient Re-evaluated:Patient Re-evaluated prior to induction Oxygen Delivery Method: Circle System Utilized Preoxygenation: Pre-oxygenation with 100% oxygen Induction Type: IV induction Ventilation: Mask ventilation without difficulty Laryngoscope Size: Mac and 3 Grade View: Grade I Tube type: Oral Tube size: 7.5 mm Number of attempts: 1 Airway Equipment and Method: Stylet and Oral airway Placement Confirmation: ETT inserted through vocal cords under direct vision,  positive ETCO2 and breath sounds checked- equal and bilateral Secured at: 23 cm Tube secured with: Tape Dental Injury: Teeth and Oropharynx as per pre-operative assessment

## 2017-02-10 NOTE — Brief Op Note (Signed)
02/10/2017  6:09 PM  PATIENT:  Todd Bowman  61 y.o. male  PRE-OPERATIVE DIAGNOSIS:  Other Spondylosis with radiculopathy, Cervical region  POST-OPERATIVE DIAGNOSIS:  Other Spondylosis with radiculopathy, Cervical region  PROCEDURE:  Procedure(s) with comments: Removal and possible replacement of cervical 7 screw (N/A) - Removal and possible replacement of cervical 7 screw  SURGEON:  Surgeon(s) and Role:    * Ditty, Loura HaltBenjamin Jared, MD - Primary  PHYSICIAN ASSISTANT: Cindra PresumeVincent Avian Konigsberg, PA-C  ANESTHESIA:   GENERAL  EBL:  0 mL   BLOOD ADMINISTERED:None  DRAINS: None  LOCAL MEDICATIONS USED:Lidocaine   SPECIMEN:  None  DISPOSITION OF SPECIMEN:  N/A  COUNTS: Correct  TOURNIQUET:  * No tourniquets in log *  PATIENT DISPOSITION:  PACU - stable  Delay start of Pharmacological VTE agent (>24hrs) due to surgical blood loss or risk of bleeding: Yes

## 2017-02-11 NOTE — Anesthesia Postprocedure Evaluation (Signed)
Anesthesia Post Note  Patient: Todd ConteLarry R Bowman  Procedure(s) Performed: Removal and possible replacement of cervical 7 screw (N/A )     Patient location during evaluation: PACU Anesthesia Type: General Level of consciousness: awake, awake and alert and oriented Pain management: pain level controlled Vital Signs Assessment: post-procedure vital signs reviewed and stable Respiratory status: spontaneous breathing, nonlabored ventilation and respiratory function stable Cardiovascular status: blood pressure returned to baseline Anesthetic complications: no    Last Vitals:  Vitals:   02/10/17 1650 02/10/17 1716  BP:  136/77  Pulse: 71 77  Resp: 17 16  Temp: 36.8 C 36.5 C  SpO2: 98% 97%    Last Pain:  Vitals:   02/10/17 1716  TempSrc: Oral  PainSc:                  Todd Bowman

## 2017-02-12 NOTE — Op Note (Signed)
02/10/2017  9:24 AM  PATIENT:  Todd Bowman  61 y.o. male  PRE-OPERATIVE DIAGNOSIS:  Dysphagia; backed out right C7 anterior plate screw  POST-OPERATIVE DIAGNOSIS:  Same  PROCEDURE:  Removal and replacement of screw in anterior cervical plate  SURGEON:  Hulan SaasBenjamin J. Ditty, MD  ASSISTANTS: Cindra PresumeVincent Costella, PA-C  ANESTHESIA:   General  DRAINS: None   SPECIMEN:  None  INDICATION FOR PROCEDURE: 61 year old male s/p ACDF three months ago.  He presents with dysphagia and a backed out anterior screw.  I recommended the above operation. Patient understood the risks, benefits, and alternatives and potential outcomes and wished to proceed.  PROCEDURE DETAILS: After smooth induction of general endotracheal anesthesia the patient was positioned supine on the OR table.  The neck was prepped and draped in the usual sterile fashion.  The incision was reopened.  Dissection was carried down medial to the SCM, carotid and jugular.  I approached the ventral surface of the spine using blunt dissection, with care taken to protect the esophagus.  The right C7 screw was found to be protruding.  It was removed.  It was replaced with a rescue screw.  The locking mechanism was closed down.  The wound was irrigated.  There was excellent hemostasis.  The wound was closed in routine anatomic layers.  The skin was sealed with Dermabond.  He awoke without difficulty.  PATIENT DISPOSITION:  PACU - hemodynamically stable.   Delay start of Pharmacological VTE agent (>24hrs) due to surgical blood loss or risk of bleeding:  yes

## 2017-02-14 ENCOUNTER — Encounter (HOSPITAL_COMMUNITY): Payer: Self-pay | Admitting: Neurological Surgery

## 2018-03-12 IMAGING — RF DG CERVICAL SPINE 1V
1 series · 1 of 1 positions shown · non-contrast
Comparison: 02/10/2017 at 1660 hours.  09/26/2016.

CLINICAL DATA: Removal and Replacement of Cervical 7 Screw for
Other Spondylosis with Radiculopathy.

Fluoro Time is 5 secs.
EXAM:
DG C-ARM 61-120 MIN; DG CERVICAL SPINE - 1 VIEW

[Series 1: run · 1 of 1 slices shown]
[im 1/1]
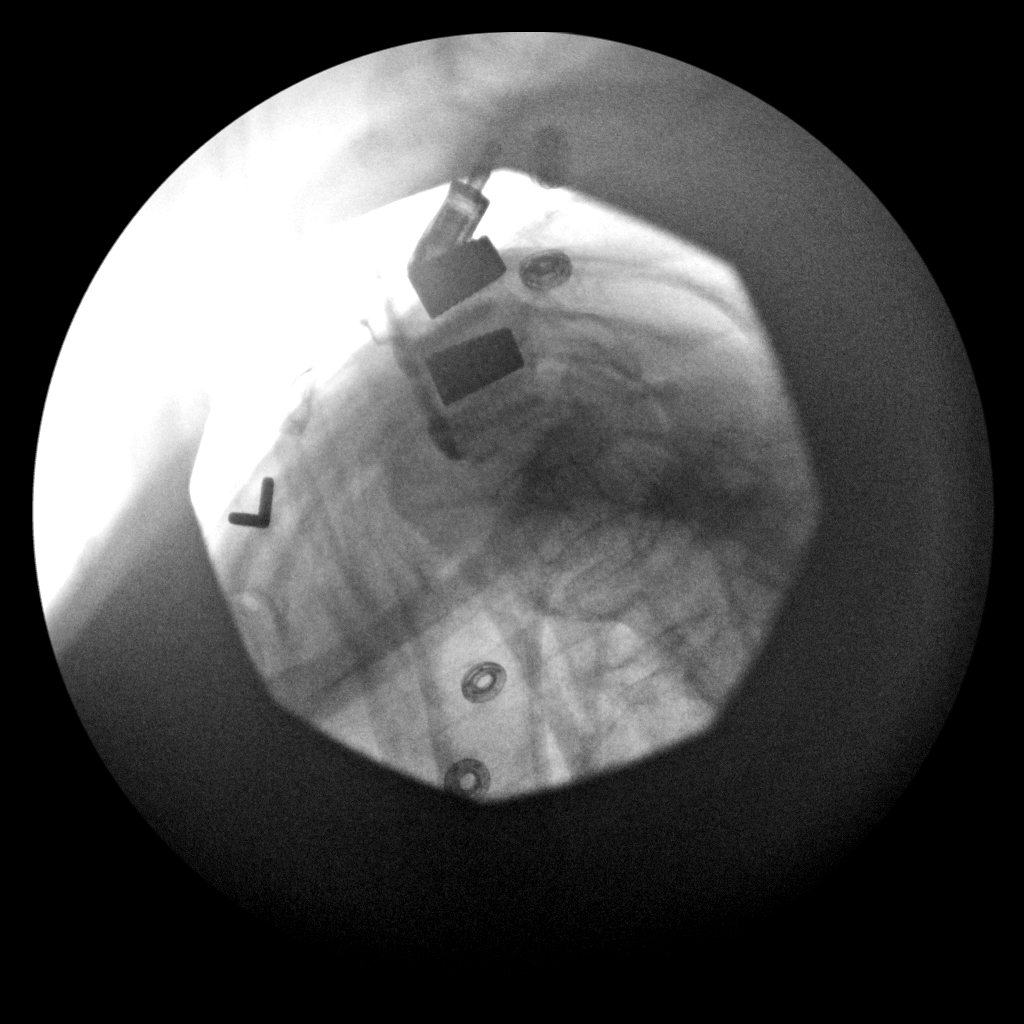

[1 of 1 positions shown; findings below may reference images not displayed]

FINDINGS: Single spot fluoro graphic images of the cervical spine shows
orthopedic hardware at the C5-C6 level and superimposed over C4,
stable from the operative images. The vertebrae are not well-defined
on this single view.
IMPRESSION: Operative imaging provided for revision of cervical fusion hardware.
